# Patient Record
Sex: Female | Born: 1955
Health system: Southern US, Community
[De-identification: ages and names within clinical notes are randomized; demographics above are authoritative.]

## PROBLEM LIST (undated history)

## (undated) DIAGNOSIS — G43909 Migraine, unspecified, not intractable, without status migrainosus: Secondary | ICD-10-CM

## (undated) DIAGNOSIS — N390 Urinary tract infection, site not specified: Secondary | ICD-10-CM

## (undated) HISTORY — DX: Urinary tract infection, site not specified: N39.0

## (undated) HISTORY — DX: Migraine, unspecified, not intractable, without status migrainosus: G43.909

---

## 1982-06-04 HISTORY — PX: TUBAL LIGATION: SHX77

## 2014-10-28 ENCOUNTER — Other Ambulatory Visit: Payer: Self-pay | Admitting: Obstetrics and Gynecology

## 2014-10-28 DIAGNOSIS — N6310 Unspecified lump in the right breast, unspecified quadrant: Secondary | ICD-10-CM

## 2014-11-03 ENCOUNTER — Other Ambulatory Visit: Payer: Self-pay

## 2014-11-18 ENCOUNTER — Ambulatory Visit
Admission: RE | Admit: 2014-11-18 | Discharge: 2014-11-18 | Disposition: A | Discharge status: Home / Self Care | Payer: BLUE CROSS/BLUE SHIELD | Source: Ambulatory Visit | Attending: Obstetrics and Gynecology | Admitting: Obstetrics and Gynecology

## 2014-11-18 DIAGNOSIS — N6310 Unspecified lump in the right breast, unspecified quadrant: Secondary | ICD-10-CM

## 2016-01-11 DIAGNOSIS — H524 Presbyopia: Secondary | ICD-10-CM | POA: Diagnosis not present

## 2016-01-17 DIAGNOSIS — Z6823 Body mass index (BMI) 23.0-23.9, adult: Secondary | ICD-10-CM | POA: Diagnosis not present

## 2016-01-17 DIAGNOSIS — Z1231 Encounter for screening mammogram for malignant neoplasm of breast: Secondary | ICD-10-CM | POA: Diagnosis not present

## 2016-01-17 DIAGNOSIS — Z01419 Encounter for gynecological examination (general) (routine) without abnormal findings: Secondary | ICD-10-CM | POA: Diagnosis not present

## 2016-07-26 DIAGNOSIS — J209 Acute bronchitis, unspecified: Secondary | ICD-10-CM | POA: Diagnosis not present

## 2017-03-14 DIAGNOSIS — Z01419 Encounter for gynecological examination (general) (routine) without abnormal findings: Secondary | ICD-10-CM | POA: Diagnosis not present

## 2017-03-14 DIAGNOSIS — Z6823 Body mass index (BMI) 23.0-23.9, adult: Secondary | ICD-10-CM | POA: Diagnosis not present

## 2017-03-14 DIAGNOSIS — Z1231 Encounter for screening mammogram for malignant neoplasm of breast: Secondary | ICD-10-CM | POA: Diagnosis not present

## 2017-03-14 LAB — HM MAMMOGRAPHY

## 2017-03-14 LAB — HM PAP SMEAR: HM Pap smear: NEGATIVE

## 2017-04-01 DIAGNOSIS — Z13820 Encounter for screening for osteoporosis: Secondary | ICD-10-CM | POA: Diagnosis not present

## 2017-04-01 LAB — HM DEXA SCAN

## 2017-04-09 DIAGNOSIS — Z7689 Persons encountering health services in other specified circumstances: Secondary | ICD-10-CM | POA: Diagnosis not present

## 2017-04-09 DIAGNOSIS — M25561 Pain in right knee: Secondary | ICD-10-CM | POA: Diagnosis not present

## 2017-04-09 DIAGNOSIS — M7989 Other specified soft tissue disorders: Secondary | ICD-10-CM | POA: Diagnosis not present

## 2017-04-17 DIAGNOSIS — M7051 Other bursitis of knee, right knee: Secondary | ICD-10-CM | POA: Diagnosis not present

## 2017-04-17 DIAGNOSIS — G8929 Other chronic pain: Secondary | ICD-10-CM | POA: Diagnosis not present

## 2017-04-17 DIAGNOSIS — M25561 Pain in right knee: Secondary | ICD-10-CM | POA: Diagnosis not present

## 2017-04-24 ENCOUNTER — Ambulatory Visit: Payer: BLUE CROSS/BLUE SHIELD | Admitting: Primary Care

## 2017-05-03 ENCOUNTER — Encounter: Payer: Self-pay | Admitting: Primary Care

## 2017-05-03 ENCOUNTER — Ambulatory Visit: Payer: BLUE CROSS/BLUE SHIELD | Admitting: Primary Care

## 2017-05-03 DIAGNOSIS — M25569 Pain in unspecified knee: Secondary | ICD-10-CM | POA: Insufficient documentation

## 2017-05-03 DIAGNOSIS — M25561 Pain in right knee: Secondary | ICD-10-CM

## 2017-05-03 DIAGNOSIS — G8929 Other chronic pain: Secondary | ICD-10-CM | POA: Diagnosis not present

## 2017-05-03 NOTE — Patient Instructions (Signed)
It was a pleasure to meet you today! Please don't hesitate to call me with any questions. Welcome to [facility]!

## 2017-05-03 NOTE — Progress Notes (Signed)
Subjective:    Patient ID: Kelly James, female    DOB: [birth year], 61 y.o.   MRN: [id number]  HPI  Kelly James is a 61 year old female who presents today to establish care and discuss the problems mentioned below. Will obtain old records. She's following with GYN annually for her physicals with her last visit in October 2018.  1) Extremity Pain: Located to right upper extremity near the biceps area. This has been chronic for years, history of cortisone injections 10-11 years. Recent soreness over the past several months when moving her mother in law.   2) Knee Pain: Located to the right knee that has been present for the past several months. She was seen at Urgent Care in February 2018. She was sent to orthopedics several weeks ago and was told that she had arthritis to her right knee. She's currently taking glucosamine with chondroitin along with other supplements.    Review of Systems  Eyes: Negative for visual disturbance.  Respiratory: Negative for shortness of breath.   Cardiovascular: Negative for chest pain.  Musculoskeletal:       Right knee pain  Neurological: Negative for dizziness and headaches.       Past Medical History:  Diagnosis Date  . Migraines   . UTI (urinary tract infection)      Social History   Socioeconomic History  . Marital status: Married    Spouse name: Not on file  . Number of children: Not on file  . Years of education: Not on file  . Highest education level: Not on file  Social Needs  . Financial resource strain: Not on file  . Food insecurity - worry: Not on file  . Food insecurity - inability: Not on file  . Transportation needs - medical: Not on file  . Transportation needs - non-medical: Not on file  Occupational History  . Not on file  Tobacco Use  . Smoking status: Never Smoker  . Smokeless tobacco: Never Used  Substance and Sexual Activity  . Alcohol use: No    Frequency: Never  . Drug use: No  . Sexual activity: Not on file    Other Topics Concern  . Not on file  Social History Narrative   Married.   2 children, 2 grandchildren.   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      Family History  Problem Relation Age of Onset  . Arthritis Mother   . Alzheimer's disease Mother   . Alcohol abuse Father   . Asthma Sister   . Asthma Brother     Allergies  Allergen Reactions  . Codeine     Other reaction(s): Agitation  . Sulfa Antibiotics Hives    Current Outpatient Medications on File Prior to Visit  Medication Sig Dispense Refill  . ALFALFA PO Take by mouth.    . B Complex-Biotin-FA (B COMPLETE PO) Take by mouth.    . Cholecalciferol (D3-1000 PO) Take by mouth.    . Multiple Vitamins-Minerals (ZINC PO) Take by mouth.    Marland Kitchen UNABLE TO FIND Med Name: CarotoMax and Eye Taking 1 capsule by mouth after breakfast daily    . UNABLE TO FIND Herb-Lax Taking 1 tablet by mouth after breakfast daily    . VITAMIN E PO Take by mouth.    . Ascorbic Acid (CHEWABLE VITAMIN C PO) Take 100 mg by mouth.     No current facility-administered medications on file prior to visit.  BP 122/82   Pulse 88   Temp 98.1 F (36.7 C) (Oral)   Ht 5' (1.524 m)   Wt 123 lb 12.8 oz (56.2 kg)   SpO2 99%   BMI 24.18 kg/m    Objective:   Physical Exam  Constitutional: She appears well-nourished.  Neck: Neck supple.  Cardiovascular: Normal rate and regular rhythm.  Pulmonary/Chest: Effort normal and breath sounds normal.  Musculoskeletal:  Ambulates well   Neurological: Coordination normal.  Skin: Skin is warm and dry.  Psychiatric: She has a normal mood and affect.          Assessment & Plan:

## 2017-05-03 NOTE — Assessment & Plan Note (Signed)
Present for the past several months, now following with orthopedics. Continue supplements as recommended. Follow up with ortho as scheduled.

## 2017-05-15 ENCOUNTER — Encounter: Payer: Self-pay | Admitting: Primary Care

## 2017-05-15 NOTE — Telephone Encounter (Signed)
FYI, updated Health Maintenance of the information that was given

## 2017-05-17 DIAGNOSIS — H524 Presbyopia: Secondary | ICD-10-CM | POA: Diagnosis not present

## 2017-05-29 ENCOUNTER — Encounter: Payer: Self-pay | Admitting: Primary Care

## 2017-06-19 DIAGNOSIS — M7521 Bicipital tendinitis, right shoulder: Secondary | ICD-10-CM | POA: Diagnosis not present

## 2017-06-19 DIAGNOSIS — G8929 Other chronic pain: Secondary | ICD-10-CM | POA: Diagnosis not present

## 2017-06-19 DIAGNOSIS — M25511 Pain in right shoulder: Secondary | ICD-10-CM | POA: Diagnosis not present

## 2017-07-01 ENCOUNTER — Encounter: Payer: Self-pay | Admitting: Primary Care

## 2017-07-19 DIAGNOSIS — M7521 Bicipital tendinitis, right shoulder: Secondary | ICD-10-CM | POA: Diagnosis not present

## 2017-07-19 DIAGNOSIS — M25511 Pain in right shoulder: Secondary | ICD-10-CM | POA: Diagnosis not present

## 2017-07-19 DIAGNOSIS — G8929 Other chronic pain: Secondary | ICD-10-CM | POA: Diagnosis not present

## 2017-08-15 ENCOUNTER — Encounter: Payer: Self-pay | Admitting: Primary Care

## 2017-09-16 ENCOUNTER — Encounter: Payer: Self-pay | Admitting: Primary Care

## 2017-10-01 ENCOUNTER — Encounter: Payer: Self-pay | Admitting: Primary Care

## 2017-11-11 ENCOUNTER — Encounter: Payer: Self-pay | Admitting: Primary Care

## 2018-04-28 DIAGNOSIS — Z6824 Body mass index (BMI) 24.0-24.9, adult: Secondary | ICD-10-CM | POA: Diagnosis not present

## 2018-04-28 DIAGNOSIS — Z1231 Encounter for screening mammogram for malignant neoplasm of breast: Secondary | ICD-10-CM | POA: Diagnosis not present

## 2018-04-28 DIAGNOSIS — Z01419 Encounter for gynecological examination (general) (routine) without abnormal findings: Secondary | ICD-10-CM | POA: Diagnosis not present

## 2018-05-19 DIAGNOSIS — H524 Presbyopia: Secondary | ICD-10-CM | POA: Diagnosis not present

## 2018-07-08 DIAGNOSIS — Z1211 Encounter for screening for malignant neoplasm of colon: Secondary | ICD-10-CM | POA: Diagnosis not present

## 2018-07-08 DIAGNOSIS — K648 Other hemorrhoids: Secondary | ICD-10-CM | POA: Diagnosis not present

## 2018-07-08 LAB — HM COLONOSCOPY

## 2018-07-10 ENCOUNTER — Encounter: Payer: Self-pay | Admitting: Primary Care

## 2018-08-26 NOTE — Telephone Encounter (Signed)
Kelly James, she may be a good candidate for WebEx, but if it's not working then phone is fine.

## 2018-08-26 NOTE — Telephone Encounter (Signed)
lvm asking pt to call office to schedule 

## 2018-08-27 ENCOUNTER — Other Ambulatory Visit: Payer: Self-pay

## 2018-08-27 ENCOUNTER — Telehealth (INDEPENDENT_AMBULATORY_CARE_PROVIDER_SITE_OTHER): Payer: BLUE CROSS/BLUE SHIELD | Admitting: Primary Care

## 2018-08-27 DIAGNOSIS — R42 Dizziness and giddiness: Secondary | ICD-10-CM | POA: Diagnosis not present

## 2018-08-27 NOTE — Patient Instructions (Signed)
Your symptoms are representative of vertigo as discussed. Take a look at the information below.   You can try meclizine medication if your symptoms return. This can me purchased over the counter.  Go to the hospital if you develop one sided weakness, numbness/tingling to the arms and legs, changes in speech.  It was nice speaking with you! Allie Bossier, NP-C   Vertigo Vertigo is the feeling that you or your surroundings are moving when they are not. Vertigo can be dangerous if it occurs while you are doing something that could endanger you or others, such as driving. What are the causes? This condition is caused by a disturbance in the signals that are sent by your body's sensory systems to your brain. Different causes of a disturbance can lead to vertigo, including:  Infections, especially in the inner ear.  A bad reaction to a drug, or misuse of alcohol and medicines.  Withdrawal from drugs or alcohol.  Quickly changing positions, as when lying down or rolling over in bed.  Migraine headaches.  Decreased blood flow to the brain.  Decreased blood pressure.  Increased pressure in the brain from a head or neck injury, stroke, infection, tumor, or bleeding.  Central nervous system disorders. What are the signs or symptoms? Symptoms of this condition usually occur when you move your head or your eyes in different directions. Symptoms may start suddenly, and they usually last for less than a minute. Symptoms may include:  Loss of balance and falling.  Feeling like you are spinning or moving.  Feeling like your surroundings are spinning or moving.  Nausea and vomiting.  Blurred vision or double vision.  Difficulty hearing.  Slurred speech.  Dizziness.  Involuntary eye movement (nystagmus). Symptoms can be mild and cause only slight annoyance, or they can be severe and interfere with daily life. Episodes of vertigo may return (recur) over time, and they are often triggered  by certain movements. Symptoms may improve over time. How is this diagnosed? This condition may be diagnosed based on medical history and the quality of your nystagmus. Your health care provider may test your eye movements by asking you to quickly change positions to trigger the nystagmus. This may be called the Dix-Hallpike test, head thrust test, or roll test. You may be referred to a health care provider who specializes in ear, nose, and throat (ENT) problems (otolaryngologist) or a provider who specializes in disorders of the central nervous system (neurologist). You may have additional testing, including:  A physical exam.  Blood tests.  MRI.  A CT scan.  An electrocardiogram (ECG). This records electrical activity in your heart.  An electroencephalogram (EEG). This records electrical activity in your brain.  Hearing tests. How is this treated? Treatment for this condition depends on the cause and the severity of the symptoms. Treatment options include:  Medicines to treat nausea or vertigo. These are usually used for severe cases. Some medicines that are used to treat other conditions may also reduce or eliminate vertigo symptoms. These include: ? Medicines that control allergies (antihistamines). ? Medicines that control seizures (anticonvulsants). ? Medicines that relieve depression (antidepressants). ? Medicines that relieve anxiety (sedatives).  Head movements to adjust your inner ear back to normal. If your vertigo is caused by an ear problem, your health care provider may recommend certain movements to correct the problem.  Surgery. This is rare. Follow these instructions at home: Safety  Move slowly.Avoid sudden body or head movements.  Avoid driving.  Avoid operating  heavy machinery.  Avoid doing any tasks that would cause danger to you or others if you would have a vertigo episode during the task.  If you have trouble walking or keeping your balance, try using  a cane for stability. If you feel dizzy or unstable, sit down right away.  Return to your normal activities as told by your health care provider. Ask your health care provider what activities are safe for you. General instructions  Take over-the-counter and prescription medicines only as told by your health care provider.  Avoid certain positions or movements as told by your health care provider.  Drink enough fluid to keep your urine clear or pale yellow.  Keep all follow-up visits as told by your health care provider. This is important. Contact a health care provider if:  Your medicines do not relieve your vertigo or they make it worse.  You have a fever.  Your condition gets worse or you develop new symptoms.  Your family or friends notice any behavioral changes.  Your nausea or vomiting gets worse.  You have numbness or a "pins and needles" sensation in part of your body. Get help right away if:  You have difficulty moving or speaking.  You are always dizzy.  You faint.  You develop severe headaches.  You have weakness in your hands, arms, or legs.  You have changes in your hearing or vision.  You develop a stiff neck.  You develop sensitivity to light. This information is not intended to replace advice given to you by your health care provider. Make sure you discuss any questions you have with your health care provider. Document Released: 02/28/2005 Document Revised: 11/02/2015 Document Reviewed: 09/13/2014 Elsevier Interactive Patient Education  2019 [employer].

## 2018-08-27 NOTE — Assessment & Plan Note (Signed)
Symptoms representative of vertigo, especially with symptoms of spinning sensation. Low suspicion for stroke given lack of cardinal symptoms. Unable to complete neuro exam via phone, we could not get WebEx video to work.  She admits to being under a lot of stress at home which could have triggered this episode. Discussed strict hospital precautions. Also discussed use of Meclizine if needed. She will update if symptoms return.

## 2018-08-27 NOTE — Progress Notes (Addendum)
   Kelly James - 63 y.o. female  MRN [id number]  Date of Birth: Dec 20, 1955  PCP: Pleas Koch, NP  This service was provided via telemedicine. Phone Visit performed on 08/27/2018    Rationale for phone visit reviewed. Patient consented to telephone encounter.  Web Ex was attempted but did not work.    Location of patient: Home Location of provider: Office at [employer] @ [facility] Name of referring provider: N/A   Names of persons and role in encounter: Provider: Pleas Koch, NP  Patient: Kelly James  Other: N/A   Time on call: 15 minutes   Subjective: CC: Dizziness HPI:  Two nights ago she was preparing to go to bed and began to feel lightheaded and nauseated. She went to lay down in bed and felt her head spinning, felt like the room was spinning, felt like her eyes were "wobbling". She closed her eyes with some improvement but still felt her head spinning. She continued to lay flat with improvement but when she attempted to roll over onto her side and her symptoms returned. She felt the need to use the bathroom so her husband assisted her to the bathroom, felt unsteady when sitting on the toilet and also with wiping. She fell asleep, woke up again to urinate in the middle of the night, felt slightly more steady and was able to walk in herself without assistance. She returned to bed and when laying down felt her head slightly spinning. She feel back asleep, woke back up to urinate again and felt nearly resolved.   This morning she woke up with a "migraine" with nausea, headache was located to the entire head. She took Tylenol Arthritis on an empty stomach with eventual resolve in her headache. She denies vomiting, diarrhea, fevers, syncope, changes in speech, unilateral weakness.    Objective/Observations:   No physical exam or vital signs collected unless specifically identified.  Respiratory status: speaks in complete sentences without evident shortness of breath.    Assessment/Plan:  Symptoms representative of vertigo, especially with symptoms of spinning sensation. Low suspicion for stroke given lack of cardinal symptoms. Unable to complete neuro exam via phone, we could not get WebEx video to work.  She admits to being under a lot of stress at home which could have triggered this episode. Discussed strict hospital precautions. Also discussed use of Meclizine if needed. She will update if symptoms return.   No problem-specific Assessment & Plan notes found for this encounter.   I discussed the assessment and treatment plan with the patient. The patient was provided an opportunity to ask questions and all were answered. The patient agreed with the plan and demonstrated an understanding of the instructions.  Lab Orders  No laboratory test(s) ordered today    No orders of the defined types were placed in this encounter.   The patient was advised to call back or seek an in-person evaluation if the symptoms worsen or if the condition fails to improve as anticipated.  Pleas Koch, NP

## 2019-02-04 DIAGNOSIS — K648 Other hemorrhoids: Secondary | ICD-10-CM | POA: Diagnosis not present

## 2019-02-18 DIAGNOSIS — K648 Other hemorrhoids: Secondary | ICD-10-CM | POA: Diagnosis not present

## 2019-03-11 DIAGNOSIS — K648 Other hemorrhoids: Secondary | ICD-10-CM | POA: Diagnosis not present

## 2019-05-21 DIAGNOSIS — Z6824 Body mass index (BMI) 24.0-24.9, adult: Secondary | ICD-10-CM | POA: Diagnosis not present

## 2019-05-21 DIAGNOSIS — Z01419 Encounter for gynecological examination (general) (routine) without abnormal findings: Secondary | ICD-10-CM | POA: Diagnosis not present

## 2019-05-21 DIAGNOSIS — M8580 Other specified disorders of bone density and structure, unspecified site: Secondary | ICD-10-CM | POA: Diagnosis not present

## 2019-05-21 DIAGNOSIS — Z1231 Encounter for screening mammogram for malignant neoplasm of breast: Secondary | ICD-10-CM | POA: Diagnosis not present

## 2019-05-21 DIAGNOSIS — Z13820 Encounter for screening for osteoporosis: Secondary | ICD-10-CM | POA: Diagnosis not present

## 2019-05-21 LAB — HM DEXA SCAN

## 2019-05-22 LAB — HM MAMMOGRAPHY

## 2019-06-22 DIAGNOSIS — H52223 Regular astigmatism, bilateral: Secondary | ICD-10-CM | POA: Diagnosis not present

## 2019-07-05 ENCOUNTER — Other Ambulatory Visit: Payer: Self-pay | Admitting: Primary Care

## 2019-07-05 DIAGNOSIS — Z1159 Encounter for screening for other viral diseases: Secondary | ICD-10-CM

## 2019-07-05 DIAGNOSIS — Z0000 Encounter for general adult medical examination without abnormal findings: Secondary | ICD-10-CM

## 2019-07-10 NOTE — Telephone Encounter (Signed)
Kelly James, we need to move her CPE near a block. This visit is going to take 40+ minutes.

## 2019-07-13 ENCOUNTER — Other Ambulatory Visit: Payer: Self-pay

## 2019-07-13 ENCOUNTER — Other Ambulatory Visit (INDEPENDENT_AMBULATORY_CARE_PROVIDER_SITE_OTHER): Payer: BLUE CROSS/BLUE SHIELD

## 2019-07-13 DIAGNOSIS — Z0000 Encounter for general adult medical examination without abnormal findings: Secondary | ICD-10-CM

## 2019-07-13 DIAGNOSIS — Z1159 Encounter for screening for other viral diseases: Secondary | ICD-10-CM

## 2019-07-13 LAB — LIPID PANEL
Cholesterol: 212 mg/dL — ABNORMAL HIGH (ref 0–200)
HDL: 53.7 mg/dL (ref >39.00)
NonHDL: 158.77
Total CHOL/HDL Ratio: 4
Triglycerides: 210 mg/dL — ABNORMAL HIGH (ref 0.0–149.0)
VLDL: 42 mg/dL — ABNORMAL HIGH (ref 0.0–40.0)

## 2019-07-13 LAB — COMPREHENSIVE METABOLIC PANEL
ALT: 22 U/L (ref 0–35)
AST: 22 U/L (ref 0–37)
Albumin: 4.6 g/dL (ref 3.5–5.2)
Alkaline Phosphatase: 54 U/L (ref 39–117)
BUN: 19 mg/dL (ref 6–23)
CO2: 30 mEq/L (ref 19–32)
Calcium: 9.9 mg/dL (ref 8.4–10.5)
Chloride: 103 mEq/L (ref 96–112)
Creatinine, Ser: 0.91 mg/dL (ref 0.40–1.20)
GFR: 62.18 mL/min (ref >60.00)
Glucose, Bld: 93 mg/dL (ref 70–99)
Potassium: 4.1 mEq/L (ref 3.5–5.1)
Sodium: 139 mEq/L (ref 135–145)
Total Bilirubin: 0.7 mg/dL (ref 0.2–1.2)
Total Protein: 7.7 g/dL (ref 6.0–8.3)

## 2019-07-13 LAB — CBC
HCT: 40.9 % (ref 36.0–46.0)
Hemoglobin: 14 g/dL (ref 12.0–15.0)
MCHC: 34.2 g/dL (ref 30.0–36.0)
MCV: 96 fl (ref 78.0–100.0)
Platelets: 244 10*3/uL (ref 150.0–400.0)
RBC: 4.25 Mil/uL (ref 3.87–5.11)
RDW: 12.4 % (ref 11.5–15.5)
WBC: 5.2 10*3/uL (ref 4.0–10.5)

## 2019-07-13 LAB — LDL CHOLESTEROL, DIRECT: Direct LDL: 133 mg/dL

## 2019-07-14 LAB — HEPATITIS C ANTIBODY
Hepatitis C Ab: NONREACTIVE
SIGNAL TO CUT-OFF: 0.02 (ref <1.00)

## 2019-07-17 ENCOUNTER — Encounter: Payer: Self-pay | Admitting: Primary Care

## 2019-07-17 ENCOUNTER — Ambulatory Visit (INDEPENDENT_AMBULATORY_CARE_PROVIDER_SITE_OTHER): Payer: BLUE CROSS/BLUE SHIELD | Admitting: Primary Care

## 2019-07-17 ENCOUNTER — Other Ambulatory Visit: Payer: Self-pay

## 2019-07-17 VITALS — BP 124/82 | HR 84 | Temp 97.2°F | Ht 60.0 in | Wt 126.5 lb

## 2019-07-17 DIAGNOSIS — K219 Gastro-esophageal reflux disease without esophagitis: Secondary | ICD-10-CM

## 2019-07-17 DIAGNOSIS — Z0000 Encounter for general adult medical examination without abnormal findings: Secondary | ICD-10-CM | POA: Diagnosis not present

## 2019-07-17 DIAGNOSIS — E785 Hyperlipidemia, unspecified: Secondary | ICD-10-CM | POA: Diagnosis not present

## 2019-07-17 DIAGNOSIS — Z23 Encounter for immunization: Secondary | ICD-10-CM | POA: Diagnosis not present

## 2019-07-17 DIAGNOSIS — R21 Rash and other nonspecific skin eruption: Secondary | ICD-10-CM

## 2019-07-17 NOTE — Assessment & Plan Note (Signed)
Tetanus and influenza due, provided today. Shingrix due, she will get a few weeks. Pap smear and mammogram UTD. Colonoscopy UTD. Encouraged a healthy diet and regular exercise.  Exam today unremarkable.  Labs reviewed.

## 2019-07-17 NOTE — Assessment & Plan Note (Signed)
LDL with slight increase. Encouraged regular exercise, healthy diet. Continue to monitor.

## 2019-07-17 NOTE — Assessment & Plan Note (Signed)
Intermittent, likely dietary induced. Handout provided regarding triggers. Discussed use of Tums if needed, Pepcid nightly for more recurrent symptoms.

## 2019-07-17 NOTE — Addendum Note (Signed)
Addended by: Jacqualin Combes on: 07/17/2019 12:11 PM   Modules accepted: Orders

## 2019-07-17 NOTE — Patient Instructions (Addendum)
Start exercising. You should be getting 150 minutes of moderate intensity exercise weekly.  It's important to improve your diet by reducing consumption of fast food, fried food, processed snack foods, sugary drinks. Increase consumption of fresh vegetables and fruits, whole grains, water.  Ensure you are drinking 64 ounces of water daily.  You will be contacted regarding your referral to dermatology.  Please let us know if you have not been contacted within two weeks.   It was a pleasure to see you today!   Preventive Care 79-85 Years Old, Female Preventive care refers to visits with your health care provider and lifestyle choices that can promote health and wellness. This includes:  A yearly physical exam. This may also be called an annual well check.  Regular dental visits and eye exams.  Immunizations.  Screening for certain conditions.  Healthy lifestyle choices, such as eating a healthy diet, getting regular exercise, not using drugs or products that contain nicotine and tobacco, and limiting alcohol use. What can I expect for my preventive care visit? Physical exam Your health care provider will check your:  Height and weight. This may be used to calculate body mass index (BMI), which tells if you are at a healthy weight.  Heart rate and blood pressure.  Skin for abnormal spots. Counseling Your health care provider may ask you questions about your:  Alcohol, tobacco, and drug use.  Emotional well-being.  Home and relationship well-being.  Sexual activity.  Eating habits.  Work and work Statistician.  Method of birth control.  Menstrual cycle.  Pregnancy history. What immunizations do I need?  Influenza (flu) vaccine  This is recommended every year. Tetanus, diphtheria, and pertussis (Tdap) vaccine  You may need a Td booster every 10 years. Varicella (chickenpox) vaccine  You may need this if you have not been vaccinated. Zoster (shingles) vaccine   You may need this after age 74. Measles, mumps, and rubella (MMR) vaccine  You may need at least one dose of MMR if you were born in 1957 or later. You may also need a second dose. Pneumococcal conjugate (PCV13) vaccine  You may need this if you have certain conditions and were not previously vaccinated. Pneumococcal polysaccharide (PPSV23) vaccine  You may need one or two doses if you smoke cigarettes or if you have certain conditions. Meningococcal conjugate (MenACWY) vaccine  You may need this if you have certain conditions. Hepatitis A vaccine  You may need this if you have certain conditions or if you travel or work in places where you may be exposed to hepatitis A. Hepatitis B vaccine  You may need this if you have certain conditions or if you travel or work in places where you may be exposed to hepatitis B. Haemophilus influenzae type b (Hib) vaccine  You may need this if you have certain conditions. Human papillomavirus (HPV) vaccine  If recommended by your health care provider, you may need three doses over 6 months. You may receive vaccines as individual doses or as more than one vaccine together in one shot (combination vaccines). Talk with your health care provider about the risks and benefits of combination vaccines. What tests do I need? Blood tests  Lipid and cholesterol levels. These may be checked every 5 years, or more frequently if you are over 30 years old.  Hepatitis C test.  Hepatitis B test. Screening  Lung cancer screening. You may have this screening every year starting at age 52 if you have a 30-pack-year history of  smoking and currently smoke or have quit within the past 15 years.  Colorectal cancer screening. All adults should have this screening starting at age 87 and continuing until age 52. Your health care provider may recommend screening at age 87 if you are at increased risk. You will have tests every 1-10 years, depending on your results and  the type of screening test.  Diabetes screening. This is done by checking your blood sugar (glucose) after you have not eaten for a while (fasting). You may have this done every 1-3 years.  Mammogram. This may be done every 1-2 years. Talk with your health care provider about when you should start having regular mammograms. This may depend on whether you have a family history of breast cancer.  BRCA-related cancer screening. This may be done if you have a family history of breast, ovarian, tubal, or peritoneal cancers.  Pelvic exam and Pap test. This may be done every 3 years starting at age 1. Starting at age 70, this may be done every 5 years if you have a Pap test in combination with an HPV test. Other tests  Sexually transmitted disease (STD) testing.  Bone density scan. This is done to screen for osteoporosis. You may have this scan if you are at high risk for osteoporosis. Follow these instructions at home: Eating and drinking  Eat a diet that includes fresh fruits and vegetables, whole grains, lean protein, and low-fat dairy.  Take vitamin and mineral supplements as recommended by your health care provider.  Do not drink alcohol if: ? Your health care provider tells you not to drink. ? You are pregnant, may be pregnant, or are planning to become pregnant.  If you drink alcohol: ? Limit how much you have to 0-1 drink a day. ? Be aware of how much alcohol is in your drink. In the [place] one drink equals one 12 oz bottle of beer (355 mL), one 5 oz glass of wine (148 mL), or one 1 oz glass of hard liquor (44 mL). Lifestyle  Take daily care of your teeth and gums.  Stay active. Exercise for at least 30 minutes on 5 or more days each week.  Do not use any products that contain nicotine or tobacco, such as cigarettes, e-cigarettes, and chewing tobacco. If you need help quitting, ask your health care provider.  If you are sexually active, practice safe sex. Use a condom or other  form of birth control (contraception) in order to prevent pregnancy and STIs (sexually transmitted infections).  If told by your health care provider, take low-dose aspirin daily starting at age 27. What's next?  Visit your health care provider once a year for a well check visit.  Ask your health care provider how often you should have your eyes and teeth checked.  Stay up to date on all vaccines. This information is not intended to replace advice given to you by your health care provider. Make sure you discuss any questions you have with your health care provider. Document Revised: 01/30/2018 Document Reviewed: 01/30/2018 Elsevier Patient Education  2020 [place] for Gastroesophageal Reflux Disease, Adult When you have gastroesophageal reflux disease (GERD), the foods you eat and your eating habits are very important. Choosing the right foods can help ease your discomfort. Think about working with a nutrition specialist (dietitian) to help you make good choices. What are tips for following this plan?  Meals  Choose healthy foods that are low in fat, such  as fruits, vegetables, whole grains, low-fat dairy products, and lean meat, fish, and poultry.  Eat small meals often instead of 3 large meals a day. Eat your meals slowly, and in a place where you are relaxed. Avoid bending over or lying down until 2-3 hours after eating.  Avoid eating meals 2-3 hours before bed.  Avoid drinking a lot of liquid with meals.  Cook foods using methods other than frying. Bake, grill, or broil food instead.  Avoid or limit: ? Chocolate. ? Peppermint or spearmint. ? Alcohol. ? Pepper. ? Black and decaffeinated coffee. ? Black and decaffeinated tea. ? Bubbly (carbonated) soft drinks. ? Caffeinated energy drinks and soft drinks.  Limit high-fat foods such as: ? Fatty meat or fried foods. ? Whole milk, cream, butter, or ice cream. ? Nuts and nut butters. ? Pastries, donuts,  and sweets made with butter or shortening.  Avoid foods that cause symptoms. These foods may be different for everyone. Common foods that cause symptoms include: ? Tomatoes. ? Oranges, lemons, and limes. ? Peppers. ? Spicy food. ? Onions and garlic. ? Vinegar. Lifestyle  Maintain a healthy weight. Ask your doctor what weight is healthy for you. If you need to lose weight, work with your doctor to do so safely.  Exercise for at least 30 minutes for 5 or more days each week, or as told by your doctor.  Wear loose-fitting clothes.  Do not smoke. If you need help quitting, ask your doctor.  Sleep with the head of your bed higher than your feet. Use a wedge under the mattress or blocks under the bed frame to raise the head of the bed. Summary  When you have gastroesophageal reflux disease (GERD), food and lifestyle choices are very important in easing your symptoms.  Eat small meals often instead of 3 large meals a day. Eat your meals slowly, and in a place where you are relaxed.  Limit high-fat foods such as fatty meat or fried foods.  Avoid bending over or lying down until 2-3 hours after eating.  Avoid peppermint and spearmint, caffeine, alcohol, and chocolate. This information is not intended to replace advice given to you by your health care provider. Make sure you discuss any questions you have with your health care provider. Document Revised: 09/11/2018 Document Reviewed: 06/26/2016 Elsevier Patient Education  [place].

## 2019-07-17 NOTE — Progress Notes (Signed)
Subjective:    Patient ID: Kelly James, female    DOB: 08-Oct-1955, 64 y.o.   MRN: [id number]  HPI  This visit occurred during the SARS-CoV-2 public health emergency.  Safety protocols were in place, including screening questions prior to the visit, additional usage of staff PPE, and extensive cleaning of exam room while observing appropriate contact time as indicated for disinfecting solutions.   Kelly James is a 64 year old female who presents today for complete physical.  Chronic and intermittent itching and rash to left medial lower extremity proximal to ankle x several months. Also with small light pink bumps to lower extremities, dryness to posterior trunk. She'll apply hydrocortisone cream with temporary improvement. She denies changes in soaps, detergents, lotions, clothing. She stopped using her shower gel to see if this was causing symptoms.  Immunizations: -Tetanus: No recent.  -Influenza: Due today. -Shingles: Never completed   Diet: She endorses a fair diet.  Exercise: She is not exercising.  Eye exam: Completes regularly  Dental exam: Completes semi-annually   Pap Smear: UTD Mammogram: Completed in 2020, follows with GYN Dexa: Completed in 2020 Colonoscopy: Completed in 2020 Hep C Screen: Negative  BP Readings from Last 3 Encounters:  07/17/19 124/82  05/03/17 122/82      Review of Systems  Constitutional: Negative for unexpected weight change.  HENT: Negative for rhinorrhea.   Respiratory: Negative for cough and shortness of breath.   Cardiovascular: Negative for chest pain.  Gastrointestinal: Negative for blood in stool, constipation and diarrhea.  Genitourinary: Negative for difficulty urinating.  Musculoskeletal: Negative for arthralgias.  Skin: Positive for rash.  Allergic/Immunologic: Negative for environmental allergies.  Neurological: Negative for dizziness, numbness and headaches.  Psychiatric/Behavioral: The patient is not nervous/anxious.         Past Medical History:  Diagnosis Date  . Migraines   . UTI (urinary tract infection)      Social History   Socioeconomic History  . Marital status: Married    Spouse name: Not on file  . Number of children: Not on file  . Years of education: Not on file  . Highest education level: Not on file  Occupational History  . Not on file  Tobacco Use  . Smoking status: Never Smoker  . Smokeless tobacco: Never Used  Substance and Sexual Activity  . Alcohol use: No  . Drug use: No  . Sexual activity: Not on file  Other Topics Concern  . Not on file  Social History Narrative   Married.   2 children, 2 grandchildren.   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   Social Determinants of Health   Financial Resource Strain:   . Difficulty of Paying Living Expenses: Not on file  Food Insecurity:   . Worried About Charity fundraiser in the Last Year: Not on file  . Ran Out of Food in the Last Year: Not on file  Transportation Needs:   . Lack of Transportation (Medical): Not on file  . Lack of Transportation (Non-Medical): Not on file  Physical Activity:   . Days of Exercise per Week: Not on file  . Minutes of Exercise per Session: Not on file  Stress:   . Feeling of Stress : Not on file  Social Connections:   . Frequency of Communication with Friends and Family: Not on file  . Frequency of Social Gatherings with Friends and Family: Not on file  . Attends Religious Services: Not on file  .  Active Member of Clubs or Organizations: Not on file  . Attends Archivist Meetings: Not on file  . Marital Status: Not on file  Intimate Partner Violence:   . Fear of Current or Ex-Partner: Not on file  . Emotionally Abused: Not on file  . Physically Abused: Not on file  . Sexually Abused: Not on file      Family History  Problem Relation Age of Onset  . Arthritis Mother   . Alzheimer's disease Mother   . Alcohol abuse Father   . Asthma Sister   .  Asthma Brother     Allergies  Allergen Reactions  . Codeine     Other reaction(s): Agitation  . Sulfa Antibiotics Hives    Current Outpatient Medications on File Prior to Visit  Medication Sig Dispense Refill  . ALFALFA PO Take by mouth.    . Ascorbic Acid (CHEWABLE VITAMIN C PO) Take 100 mg by mouth.    . B Complex-Biotin-FA (B COMPLETE PO) Take by mouth.    . Cholecalciferol (D3-1000 PO) Take by mouth.    . Misc Natural Products (JOINT SUPPORT COMPLEX PO) Take by mouth.    . Multiple Vitamins-Minerals (ZINC PO) Take by mouth.    . Nutritional Supplements (SOY PROTEIN SHAKE) POWD Take by mouth.    Marland Kitchen UNABLE TO FIND Med Name: CarotoMax and Eye Taking 1 capsule by mouth after breakfast daily    . UNABLE TO FIND Herb-Lax Taking 1 tablet by mouth after breakfast daily    . UNABLE TO FIND Med Name: Mind Work    . VITAMIN E PO Take by mouth.    Marland Kitchen ZINC OXIDE PO Take by mouth.     No current facility-administered medications on file prior to visit.    BP 124/82   Pulse 84   Temp (!) 97.2 F (36.2 C) (Temporal)   Ht 5' (1.524 m)   Wt 126 lb 8 oz (57.4 kg)   SpO2 99%   BMI 24.71 kg/m    Objective:   Physical Exam  Constitutional: She is oriented to person, place, and time. She appears well-nourished.  HENT:  Right Ear: Tympanic membrane and ear canal normal.  Left Ear: Tympanic membrane and ear canal normal.  Mouth/Throat: Oropharynx is clear and moist.  Eyes: Pupils are equal, round, and reactive to light. EOM are normal.  Cardiovascular: Normal rate and regular rhythm.  Respiratory: Effort normal and breath sounds normal.  GI: Soft. Bowel sounds are normal. There is no abdominal tenderness.  Musculoskeletal:        General: Normal range of motion.     Cervical back: Neck supple.  Neurological: She is alert and oriented to person, place, and time. No cranial nerve deficit.  Reflex Scores:      Patellar reflexes are 2+ on the right side and 2+ on the left side. Skin:  Skin is warm and dry.  Psychiatric: She has a normal mood and affect.           Assessment & Plan:

## 2019-07-22 DIAGNOSIS — R21 Rash and other nonspecific skin eruption: Secondary | ICD-10-CM

## 2019-07-30 DIAGNOSIS — D485 Neoplasm of uncertain behavior of skin: Secondary | ICD-10-CM | POA: Diagnosis not present

## 2019-07-30 DIAGNOSIS — L821 Other seborrheic keratosis: Secondary | ICD-10-CM | POA: Diagnosis not present

## 2019-07-30 DIAGNOSIS — L820 Inflamed seborrheic keratosis: Secondary | ICD-10-CM | POA: Diagnosis not present

## 2019-08-03 ENCOUNTER — Telehealth: Payer: Self-pay

## 2019-08-03 NOTE — Telephone Encounter (Signed)
Pt has NV tomorrow. Need to screen her for covid. LVM

## 2019-08-04 ENCOUNTER — Ambulatory Visit (INDEPENDENT_AMBULATORY_CARE_PROVIDER_SITE_OTHER): Payer: BLUE CROSS/BLUE SHIELD | Admitting: *Deleted

## 2019-08-04 ENCOUNTER — Other Ambulatory Visit: Payer: Self-pay

## 2019-08-04 DIAGNOSIS — Z23 Encounter for immunization: Secondary | ICD-10-CM | POA: Diagnosis not present

## 2019-08-04 NOTE — Progress Notes (Signed)
Per orders of Alma Friendly, NP, injection of Shingrix  given by Virl Cagey. Patient tolerated injection well.

## 2019-09-10 ENCOUNTER — Other Ambulatory Visit: Payer: Self-pay

## 2019-09-10 ENCOUNTER — Ambulatory Visit (INDEPENDENT_AMBULATORY_CARE_PROVIDER_SITE_OTHER): Payer: BLUE CROSS/BLUE SHIELD | Admitting: Dermatology

## 2019-09-10 DIAGNOSIS — D2272 Melanocytic nevi of left lower limb, including hip: Secondary | ICD-10-CM | POA: Diagnosis not present

## 2019-09-10 DIAGNOSIS — D485 Neoplasm of uncertain behavior of skin: Secondary | ICD-10-CM

## 2019-09-10 DIAGNOSIS — L821 Other seborrheic keratosis: Secondary | ICD-10-CM | POA: Diagnosis not present

## 2019-09-10 DIAGNOSIS — D229 Melanocytic nevi, unspecified: Secondary | ICD-10-CM

## 2019-09-10 HISTORY — DX: Melanocytic nevi, unspecified: D22.9

## 2019-09-10 NOTE — Patient Instructions (Signed)

## 2019-09-10 NOTE — Progress Notes (Signed)
   Follow-Up Visit   Subjective  Kelly James is a 64 y.o. female who presents for the following: irregular nevus (L ant thigh above the knee - previously noted on the last visit. Patient is here today for shave removal and biopsy.). Last seen about 2 mos ago for ISKs of legs, now improved on Amlactin Rapid relief treatment.  The following portions of the chart were reviewed this encounter and updated as appropriate: Tobacco  Allergies  Meds  Problems  Med Hx  Surg Hx  Fam Hx     Review of Systems: No other skin or systemic complaints.  Objective  Well appearing patient in no apparent distress; mood and affect are within normal limits.  A focused examination was performed including the left leg. Relevant physical exam findings are noted in the Assessment and Plan.  Objective  L ant thigh near the knee: 0.2 cm dark brown macule  Assessment & Plan  Neoplasm of uncertain behavior of skin L ant thigh near the knee  Epidermal / dermal shaving  Lesion length (cm):  0.2 Lesion width (cm):  0.2 Margin per side (cm):  0.2 Total excision diameter (cm):  0.6 Informed consent: discussed and consent obtained   Timeout: patient name, date of birth, surgical site, and procedure verified   Procedure prep:  Patient was prepped and draped in usual sterile fashion Prep type:  Isopropyl alcohol Anesthesia: the lesion was anesthetized in a standard fashion   Anesthetic:  1% lidocaine w/ epinephrine 1-100,000 buffered w/ 8.4% NaHCO3 Instrument used: flexible razor blade   Hemostasis achieved with: pressure, aluminum chloride and electrodesiccation   Outcome: patient tolerated procedure well   Post-procedure details: sterile dressing applied and wound care instructions given   Dressing type: bandage and petrolatum    Specimen 1 - Surgical pathology Differential Diagnosis: D48.5 r/o dysplastic nevus Check Margins: No 0.2 cm dark brown macule  Seborrheic Keratoses of legs - no longer  inflamed after Amlactin Rapid Relief lotion tx. - Stuck-on, waxy, tan-brown papules and plaques  - Discussed benign etiology and prognosis. - Observe - Call for any changes   Return if symptoms worsen or fail to improve.   Luther Redo, CMA, am acting as scribe for Sarina Ser, MD .

## 2019-09-11 ENCOUNTER — Encounter: Payer: Self-pay | Admitting: Dermatology

## 2019-09-15 ENCOUNTER — Telehealth: Payer: Self-pay

## 2019-09-15 NOTE — Telephone Encounter (Signed)
Advised patient of pathology results and scheduled a 6 month appointment for TBSE.

## 2019-10-15 DIAGNOSIS — Z23 Encounter for immunization: Secondary | ICD-10-CM | POA: Diagnosis not present

## 2019-11-05 DIAGNOSIS — Z23 Encounter for immunization: Secondary | ICD-10-CM | POA: Diagnosis not present

## 2019-12-24 ENCOUNTER — Ambulatory Visit: Payer: BLUE CROSS/BLUE SHIELD

## 2019-12-30 ENCOUNTER — Other Ambulatory Visit: Payer: Self-pay

## 2019-12-30 ENCOUNTER — Ambulatory Visit (INDEPENDENT_AMBULATORY_CARE_PROVIDER_SITE_OTHER): Payer: BLUE CROSS/BLUE SHIELD

## 2019-12-30 DIAGNOSIS — Z23 Encounter for immunization: Secondary | ICD-10-CM

## 2019-12-30 NOTE — Progress Notes (Signed)
Per orders of Alma Friendly, NP, injection of Shingrix given by Judeth Horn, CMA. Patient tolerated injection well.

## 2020-03-03 ENCOUNTER — Ambulatory Visit (INDEPENDENT_AMBULATORY_CARE_PROVIDER_SITE_OTHER): Payer: BLUE CROSS/BLUE SHIELD | Admitting: Primary Care

## 2020-03-03 ENCOUNTER — Encounter: Payer: Self-pay | Admitting: Primary Care

## 2020-03-03 ENCOUNTER — Other Ambulatory Visit: Payer: Self-pay

## 2020-03-03 DIAGNOSIS — A6000 Herpesviral infection of urogenital system, unspecified: Secondary | ICD-10-CM

## 2020-03-03 DIAGNOSIS — M2560 Stiffness of unspecified joint, not elsewhere classified: Secondary | ICD-10-CM

## 2020-03-03 NOTE — Progress Notes (Signed)
Subjective:    Patient ID: Chip Boer, female    DOB: 06/09/1955, 64 y.o.   MRN: [id number]  HPI  This visit occurred during the SARS-CoV-2 public health emergency.  Safety protocols were in place, including screening questions prior to the visit, additional usage of staff PPE, and extensive cleaning of exam room while observing appropriate contact time as indicated for disinfecting solutions.   Ms. Swartzentruber is a 64 year old female with a history of vertigo, knee pain, GERD who presents today to discuss her right thumb. She also has an active herpes outbreak.  1) Genital Herpes: History of infrequent breakouts that occur typically 6-8 months on average. Last breakout was in June 2021. Yesterday morning after showering she noticed a herpes lesion that is slightly burning. Historically she doesn't take anything for her herpes, she just applies Bacitracin with resolve. She did this yesterday and today.  2) Thumb Stiffness: She's noticed a popping sensation with stiffness to the DIP joint of her thumb that began about two weeks ago. Over the last week she's noticed increased stiffness to the joint when waking during the night. When she rises in the morning her stiffness has improved. The stiffness does not wake her from sleep.  She's not noticed the joint getting stuck. While she's busy working with her hands, she does not notice the stiffness or popping sensation.   She's not taken anything for her symptoms. She denies swelling or redness. She has noticed intermittent discomfort to the metacarpal joint.   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   Review of Systems  Genitourinary:       Genital herpes  Musculoskeletal: Positive for arthralgias. Negative for joint swelling and myalgias.  Skin: Negative for color change.       Past Medical History:  Diagnosis Date  . Migraines   . UTI  (urinary tract infection)      Social History   Socioeconomic History  . Marital status: Married    Spouse name: Not on file  . Number of children: Not on file  . Years of education: Not on file  . Highest education level: Not on file  Occupational History  . Not on file  Tobacco Use  . Smoking status: Never Smoker  . Smokeless tobacco: Never Used  Substance and Sexual Activity  . Alcohol use: No  . Drug use: No  . Sexual activity: Not on file  Other Topics Concern  . Not on file  Social History Narrative   Married.   2 children, 2 grandchildren.   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   Social Determinants of Health   Financial Resource Strain:   . Difficulty of Paying Living Expenses: Not on file  Food Insecurity:   . Worried About Charity fundraiser in the Last Year: Not on file  . Ran Out of Food in the Last Year: Not on file  Transportation Needs:   . Lack of Transportation (Medical): Not on file  . Lack of Transportation (Non-Medical): Not on file  Physical Activity:   . Days of Exercise per Week: Not on file  . Minutes of Exercise per Session: Not on file  Stress:   . Feeling of Stress : Not on file  Social Connections:   . Frequency of Communication with Friends and Family: Not on file  . Frequency  of Social Gatherings with Friends and Family: Not on file  . Attends Religious Services: Not on file  . Active Member of Clubs or Organizations: Not on file  . Attends Archivist Meetings: Not on file  . Marital Status: Not on file  Intimate Partner Violence:   . Fear of Current or Ex-Partner: Not on file  . Emotionally Abused: Not on file  . Physically Abused: Not on file  . Sexually Abused: Not on file    Past Surgical History:  Procedure Laterality Date  . TUBAL LIGATION  1984    Family History  Problem Relation Age of Onset  . Arthritis Mother   . Alzheimer's disease Mother   . Alcohol abuse Father   . Asthma Sister     . Asthma Brother     Allergies  Allergen Reactions  . Codeine     Other reaction(s): Agitation  . Sulfa Antibiotics Hives    Current Outpatient Medications on File Prior to Visit  Medication Sig Dispense Refill  . ALFALFA PO Take by mouth.    . Ascorbic Acid (CHEWABLE VITAMIN C PO) Take 100 mg by mouth.    . B Complex-Biotin-FA (B COMPLETE PO) Take by mouth.    . Cholecalciferol (D3-1000 PO) Take by mouth.    . Misc Natural Products (JOINT SUPPORT COMPLEX PO) Take by mouth.    . Nutritional Supplements (SOY PROTEIN SHAKE) POWD Take by mouth.    Marland Kitchen UNABLE TO FIND Med Name: CarotoMax and Eye Taking 1 capsule by mouth after breakfast daily    . UNABLE TO FIND Herb-Lax Taking 1 tablet by mouth after breakfast daily    . UNABLE TO FIND Med Name: Mind Work    . VITAMIN E PO Take by mouth.    Marland Kitchen ZINC OXIDE PO Take by mouth.     No current facility-administered medications on file prior to visit.    BP 126/78   Pulse (!) 102   Temp (!) 95.9 F (35.5 C) (Temporal)   Ht 5' (1.524 m)   Wt 129 lb (58.5 kg)   SpO2 98%   BMI 25.19 kg/m    Objective:   Physical Exam Musculoskeletal:     Comments: Normal ROM to right first digit. No swelling or erythema to metacarpal or DIP joint.   Skin:    General: Skin is warm and dry.     Findings: No erythema.  Neurological:     Mental Status: She is alert.            Assessment & Plan:

## 2020-03-03 NOTE — Assessment & Plan Note (Signed)
To right first digit x 2 weeks. I did witness the popping sensation but no obvious trigger finger was identified. She does not appear to have inflammation or infection within the joint.  We discussed for her to refrain from activities that exacerbates the symptoms.  Also remain active.  She will update if symptoms progress or do not resolve.  We discussed for her to see our sports medicine doctor for any concern.

## 2020-03-03 NOTE — Assessment & Plan Note (Signed)
Chronic for years, typically treats with bacitracin and experiences resolve.  Continue to monitor.

## 2020-03-03 NOTE — Patient Instructions (Signed)
Refrain from rolling out hard fondant.  Be sure to keep your hand active.  Consider a visit with Dr. Lorelei Pont if your symptoms persist and/or progress.  It was a pleasure to see you today!

## 2020-03-17 ENCOUNTER — Ambulatory Visit: Payer: BLUE CROSS/BLUE SHIELD | Admitting: Dermatology

## 2020-03-17 ENCOUNTER — Encounter: Payer: Self-pay | Admitting: Primary Care

## 2020-04-07 ENCOUNTER — Encounter: Payer: Self-pay | Admitting: Family Medicine

## 2020-04-07 ENCOUNTER — Other Ambulatory Visit: Payer: Self-pay

## 2020-04-07 ENCOUNTER — Ambulatory Visit (INDEPENDENT_AMBULATORY_CARE_PROVIDER_SITE_OTHER): Payer: Medicare Other | Admitting: Family Medicine

## 2020-04-07 VITALS — BP 120/68 | HR 98 | Temp 98.0°F | Ht 60.0 in | Wt 128.8 lb

## 2020-04-07 DIAGNOSIS — M65311 Trigger thumb, right thumb: Secondary | ICD-10-CM | POA: Diagnosis not present

## 2020-04-07 NOTE — Progress Notes (Signed)
    Isaia Hassell T. Adaly Puder, MD, [facility] [place] at [facility] [place], [address]  Phone: [phone]  FAX: [phone]  Kelly James - 64 y.o. female  MRN [id number]  Date of Birth: Sep 21, 1955  Date: 04/07/2020  PCP: Pleas Koch, NP  Referral: Pleas Koch, NP  Chief Complaint  Patient presents with  . Trigger Finger    Right Thumb    This visit occurred during the SARS-CoV-2 public health emergency.  Safety protocols were in place, including screening questions prior to the visit, additional usage of staff PPE, and extensive cleaning of exam room while observing appropriate contact time as indicated for disinfecting solutions.    Total encounter time: 10 minutes. This includes total time spent on the day of encounter.  She is here today for further evaluation and treatment by Mrs. Clark and she does have a right-sided obvious trigger thumb.  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  At this point, she would like to just observe this, and I think this is reasonable.  At any point if she wants to have a trigger thumb injection, that I am happy to do this for her.    ICD-10-CM   1. Trigger thumb, right thumb  M65.311    Signed,  Emma Schupp T. Jessika Rothery, MD

## 2020-05-20 DIAGNOSIS — R3989 Other symptoms and signs involving the genitourinary system: Secondary | ICD-10-CM

## 2020-05-23 NOTE — Telephone Encounter (Signed)
Joellen, can you put these immunizations in?

## 2020-05-24 NOTE — Telephone Encounter (Signed)
All immunizations are in chart.

## 2020-05-31 ENCOUNTER — Other Ambulatory Visit: Payer: Self-pay

## 2020-05-31 ENCOUNTER — Other Ambulatory Visit (INDEPENDENT_AMBULATORY_CARE_PROVIDER_SITE_OTHER): Payer: Medicare Other

## 2020-05-31 DIAGNOSIS — R3989 Other symptoms and signs involving the genitourinary system: Secondary | ICD-10-CM | POA: Diagnosis not present

## 2020-06-05 LAB — HSV 1/2 AB (IGM), IFA W/RFLX TITER
HSV 1 IgM Screen: NEGATIVE
HSV 2 IgM Screen: NEGATIVE

## 2020-06-05 LAB — HSV(HERPES SIMPLEX VRS) I + II AB-IGG
HAV 1 IGG,TYPE SPECIFIC AB: 35.7 index — ABNORMAL HIGH
HSV 2 IGG,TYPE SPECIFIC AB: 7.75 index — ABNORMAL HIGH

## 2020-06-27 ENCOUNTER — Other Ambulatory Visit: Payer: Self-pay

## 2020-06-27 ENCOUNTER — Ambulatory Visit (INDEPENDENT_AMBULATORY_CARE_PROVIDER_SITE_OTHER): Payer: Medicare Other | Admitting: Dermatology

## 2020-06-27 DIAGNOSIS — D1800 Hemangioma unspecified site: Secondary | ICD-10-CM

## 2020-06-27 DIAGNOSIS — Z86018 Personal history of other benign neoplasm: Secondary | ICD-10-CM

## 2020-06-27 DIAGNOSIS — L239 Allergic contact dermatitis, unspecified cause: Secondary | ICD-10-CM

## 2020-06-27 DIAGNOSIS — L814 Other melanin hyperpigmentation: Secondary | ICD-10-CM

## 2020-06-27 DIAGNOSIS — Z1283 Encounter for screening for malignant neoplasm of skin: Secondary | ICD-10-CM

## 2020-06-27 DIAGNOSIS — D229 Melanocytic nevi, unspecified: Secondary | ICD-10-CM

## 2020-06-27 DIAGNOSIS — L821 Other seborrheic keratosis: Secondary | ICD-10-CM

## 2020-06-27 DIAGNOSIS — L578 Other skin changes due to chronic exposure to nonionizing radiation: Secondary | ICD-10-CM

## 2020-06-27 NOTE — Progress Notes (Signed)
° °  Follow-Up Visit   Subjective  Kelly James is a 65 y.o. female who presents for the following: Annual Exam (History of atypical nevus - TBSE today). The patient presents for Total-Body Skin Exam (TBSE) for skin cancer screening and mole check.  The following portions of the chart were reviewed this encounter and updated as appropriate:   Tobacco   Allergies   Meds   Problems   Med Hx   Surg Hx   Fam Hx      Review of Systems:  No other skin or systemic complaints except as noted in HPI or Assessment and Plan.  Objective  Well appearing patient in no apparent distress; mood and affect are within normal limits.  A full examination was performed including scalp, head, eyes, ears, nose, lips, neck, chest, axillae, abdomen, back, buttocks, bilateral upper extremities, bilateral lower extremities, hands, feet, fingers, toes, fingernails, and toenails. All findings within normal limits unless otherwise noted below.  Objective  Scalp: Clear   Assessment & Plan    History of Dysplastic Nevi - No evidence of recurrence today - Recommend regular full body skin exams - Recommend daily broad spectrum sunscreen SPF 30+ to sun-exposed areas, reapply every 2 hours as needed.  - Call if any new or changing lesions are noted between office visits  Lentigines - Scattered tan macules - Discussed due to sun exposure - Benign, observe - Call for any changes  Seborrheic Keratoses - Stuck-on, waxy, tan-brown papules and plaques  - Discussed benign etiology and prognosis. - Observe - Call for any changes  Melanocytic Nevi - Tan-brown and/or pink-flesh-colored symmetric macules and papules - Benign appearing on exam today - Observation - Call clinic for new or changing moles - Recommend daily use of broad spectrum spf 30+ sunscreen to sun-exposed areas.   Hemangiomas - Red papules - Discussed benign nature - Observe - Call for any changes  Actinic Damage - Chronic, secondary to  cumulative UV/sun exposure - diffuse scaly erythematous macules with underlying dyspigmentation - Recommend daily broad spectrum sunscreen SPF 30+ to sun-exposed areas, reapply every 2 hours as needed.  - Call for new or changing lesions.  Skin cancer screening performed today.  Allergic dermatitis Scalp With pruritus - persistent with flares Patient attributes itching to sulfates in shampoo and conditioner. Recommend she try Free and Clear Shampoo and Conditioner - samples given. Consider PatchTesting (True Test x 36) in future. Consider Rx for symptoms.  Pt declines today.  Return in about 1 year (around 06/27/2021) for TBSE.  I, Ashok Cordia, CMA, am acting as scribe for Sarina Ser, MD .  Documentation: I have reviewed the above documentation for accuracy and completeness, and I agree with the above.  Sarina Ser, MD

## 2020-06-28 ENCOUNTER — Encounter: Payer: Self-pay | Admitting: Dermatology

## 2020-09-28 DIAGNOSIS — Z20822 Contact with and (suspected) exposure to covid-19: Secondary | ICD-10-CM | POA: Diagnosis not present

## 2020-10-21 ENCOUNTER — Ambulatory Visit (INDEPENDENT_AMBULATORY_CARE_PROVIDER_SITE_OTHER): Payer: Medicare Other | Admitting: Primary Care

## 2020-10-21 ENCOUNTER — Other Ambulatory Visit: Payer: Self-pay

## 2020-10-21 ENCOUNTER — Encounter: Payer: Self-pay | Admitting: Primary Care

## 2020-10-21 DIAGNOSIS — R519 Headache, unspecified: Secondary | ICD-10-CM | POA: Diagnosis not present

## 2020-10-21 NOTE — Patient Instructions (Signed)
Please notify me if your headaches return.   It was a pleasure to see you today!

## 2020-10-21 NOTE — Assessment & Plan Note (Signed)
New daily headaches that are relieved temporarily with Tylenol Arthritis.   No headache today. Neuro exam today unremarkable.  She kindly declines treatment today given that her headache is gone. We agreed that if her headache returns she will call and we will proceed with a Toradol IM 60 mg.   Consider imaging if headaches do return.

## 2020-10-21 NOTE — Progress Notes (Signed)
Subjective:    Patient ID: Kelly James, female    DOB: 1956-02-08, 65 y.o.   MRN: [id number]  HPI  Kelly James is a very pleasant 65 y.o. female with a history of vertigo, GERD who presents today to discuss headaches.   Headaches began about 4-5 weeks ago for which are daily and located to the frontal lobes and occipital lobes. Over the last several days her headaches have become intense.   She's been taking Tylenol Arthritis which has helped some. She's also tried Sinus and Cold medication without improvement.    History of frequent headaches during her younger years, has had one migraine that she can remember. She does endorse increased stress recently.   She denies neck pain, photophobia, phonophobia, dizziness, visual changes, and nausea. She saw her eye doctor last week, stable exam. She actually has no headache today.   She had a negative Covid-19 test on 04/27, symptoms started just after her test.   BP Readings from Last 3 Encounters:  10/21/20 126/82  04/07/20 120/68  03/03/20 126/78      Review of Systems  Eyes: Negative for photophobia.  Gastrointestinal: Negative for nausea.  Neurological: Positive for headaches.         Past Medical History:  Diagnosis Date  . Atypical mole 09/10/2019   L ant thigh near the knee   . Migraines   . UTI (urinary tract infection)     Social History   Socioeconomic History  . Marital status: Married    Spouse name: Not on file  . Number of children: Not on file  . Years of education: Not on file  . Highest education level: Not on file  Occupational History  . Not on file  Tobacco Use  . Smoking status: Never Smoker  . Smokeless tobacco: Never Used  Substance and Sexual Activity  . Alcohol use: No  . Drug use: No  . Sexual activity: Not on file  Other Topics Concern  . Not on file  Social History Narrative   Married.   2 children, 2 grandchildren.   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    Social Determinants of Health   Financial Resource Strain: Not on file  Food Insecurity: Not on file  Transportation Needs: Not on file  Physical Activity: Not on file  Stress: Not on file  Social Connections: Not on file  Intimate Partner Violence: Not on file    Past Surgical History:  Procedure Laterality Date  . TUBAL LIGATION  1984    Family History  Problem Relation Age of Onset  . Arthritis Mother   . Alzheimer's disease Mother   . Alcohol abuse Father   . Asthma Sister   . Asthma Brother     Allergies  Allergen Reactions  . Codeine     Other reaction(s): Agitation  . Sulfa Antibiotics Hives    Current Outpatient Medications on File Prior to Visit  Medication Sig Dispense Refill  . ALFALFA PO Take by mouth.    . Ascorbic Acid (CHEWABLE VITAMIN C PO) Take 100 mg by mouth.    . B Complex-Biotin-FA (B COMPLETE PO) Take by mouth.    Marland Kitchen CALCIUM PO Take 1,560 mg by mouth daily.    . Cholecalciferol (D3-1000 PO) Take by mouth.    . Misc Natural Products (JOINT SUPPORT COMPLEX PO) Take by mouth.    . Nutritional Supplements (NUTRITIONAL SUPPLEMENT PO) Take by mouth. Stress Relief Complex-One tablet daily    .  Nutritional Supplements (SOY PROTEIN SHAKE) POWD Take by mouth.    . TURMERIC PO Take by mouth.    Marland Kitchen UNABLE TO FIND Med Name: CarotoMax and Eye Taking 1 capsule by mouth after breakfast daily    . UNABLE TO FIND Herb-Lax Taking 1 tablet by mouth after breakfast daily    . UNABLE TO FIND Med Name: Mind Work    . VITAMIN E PO Take by mouth.    Marland Kitchen ZINC OXIDE PO Take by mouth.    Marland Kitchen acyclovir (ZOVIRAX) 400 MG tablet Take 400 mg by mouth 2 (two) times daily.    Marland Kitchen neomycin-polymyxin-dexamethasone (MAXITROL) 0.1 % ophthalmic suspension neomycin-polymyxin-dexameth 3.5 mg/mL-10,000 unit/mL-0.1% eye drops  APPLY 1 DROP IN THE AFFECTED EYE FOUR TIMES DAILY X 10 DAYS     No current facility-administered medications on file prior to visit.    BP 126/82   Pulse 100    Temp 98.6 F (37 C) (Temporal)   Ht 5' (1.524 m)   Wt 133 lb (60.3 kg)   SpO2 99%   BMI 25.97 kg/m  Objective:   Physical Exam Eyes:     Extraocular Movements: Extraocular movements intact.  Pulmonary:     Effort: Pulmonary effort is normal.  Skin:    General: Skin is warm and dry.  Neurological:     Mental Status: She is alert and oriented to person, place, and time.     Cranial Nerves: No cranial nerve deficit.           Assessment & Plan:      This visit occurred during the SARS-CoV-2 public health emergency.  Safety protocols were in place, including screening questions prior to the visit, additional usage of staff PPE, and extensive cleaning of exam room while observing appropriate contact time as indicated for disinfecting solutions.

## 2020-10-25 NOTE — Telephone Encounter (Signed)
Kelly James, she needs an injection of Toradol 60 mg IM. Can you arrange this for her? Dx: frequent headaches

## 2020-11-09 ENCOUNTER — Other Ambulatory Visit: Payer: Self-pay

## 2020-11-09 ENCOUNTER — Ambulatory Visit (INDEPENDENT_AMBULATORY_CARE_PROVIDER_SITE_OTHER): Payer: Medicare Other

## 2020-11-09 DIAGNOSIS — R519 Headache, unspecified: Secondary | ICD-10-CM | POA: Diagnosis not present

## 2020-11-09 MED ORDER — KETOROLAC TROMETHAMINE 60 MG/2ML IM SOLN
60.0000 mg | Freq: Once | INTRAMUSCULAR | Status: AC
  Administered 2020-11-09: 60 mg via INTRAMUSCULAR

## 2020-11-09 NOTE — Progress Notes (Signed)
Per orders of Allie Bossier, injection of Toradol 60 mg, given by Loreen Freud. Patient tolerated injection well.

## 2020-11-16 DIAGNOSIS — G4452 New daily persistent headache (NDPH): Secondary | ICD-10-CM

## 2020-11-16 DIAGNOSIS — R519 Headache, unspecified: Secondary | ICD-10-CM

## 2020-11-18 ENCOUNTER — Encounter: Payer: Self-pay | Admitting: *Deleted

## 2020-11-29 ENCOUNTER — Other Ambulatory Visit: Payer: Self-pay

## 2020-11-29 ENCOUNTER — Ambulatory Visit
Admission: RE | Admit: 2020-11-29 | Discharge: 2020-11-29 | Disposition: A | Discharge status: Home / Self Care | Payer: Medicare Other | Source: Ambulatory Visit | Attending: Primary Care | Admitting: Primary Care

## 2020-11-29 DIAGNOSIS — G4452 New daily persistent headache (NDPH): Secondary | ICD-10-CM | POA: Diagnosis present

## 2020-11-29 DIAGNOSIS — R519 Headache, unspecified: Secondary | ICD-10-CM | POA: Diagnosis not present

## 2020-11-30 DIAGNOSIS — G4452 New daily persistent headache (NDPH): Secondary | ICD-10-CM

## 2020-11-30 DIAGNOSIS — R9089 Other abnormal findings on diagnostic imaging of central nervous system: Secondary | ICD-10-CM

## 2020-12-08 ENCOUNTER — Encounter: Payer: Self-pay | Admitting: Neurology

## 2020-12-08 ENCOUNTER — Ambulatory Visit (INDEPENDENT_AMBULATORY_CARE_PROVIDER_SITE_OTHER): Payer: Medicare Other | Admitting: Neurology

## 2020-12-08 VITALS — BP 143/88 | HR 94 | Ht 60.0 in | Wt 133.0 lb

## 2020-12-08 DIAGNOSIS — G4489 Other headache syndrome: Secondary | ICD-10-CM | POA: Diagnosis not present

## 2020-12-08 NOTE — Progress Notes (Signed)
Reason for visit: Headache  Referring physician: Dr. [name] is a 65 y.o. female  History of present illness:  Kelly James is a 65 year old right-handed white female with a history of migraine headache when she was in her teens and 41s.  The patient claims at that point she went on supplements and her headaches went away and she has not been bothered with headaches throughout her life.  Sometime in mid May 2022 however, she began having headaches again that became daily.  The patient reports that headaches could be in the front or back of the head, on top of the head or on one side or the other.  She denies any photophobia or phonophobia or nausea or vomiting with a headache.  The headache would respond to some degree with Tylenol.  She reported some cognitive clouding when the headache came on.  She does have occasional neck discomfort but this is not a significant issue for her.  She denies any numbness or weakness of the extremities or balance changes or difficulty controlling the bowels or the bladder.  She underwent MRI of the brain that was relatively unremarkable.  Since the MRI was done, over the last week or so her headaches have dissipated.  She comes here for further evaluation.  She reports that she is followed by her eye doctor with some elevation in intraocular pressures in the 24 or 25 range.  She does report she is under some stress at home.  Past Medical History:  Diagnosis Date   Atypical mole 09/10/2019   L ant thigh near the knee    Migraines    UTI (urinary tract infection)     Past Surgical History:  Procedure Laterality Date   TUBAL LIGATION  1984    Family History  Problem Relation Age of Onset   Arthritis Mother    Alzheimer's disease Mother    Alcohol abuse Father    Asthma Sister    Asthma Brother     Social history:  reports that she has never smoked. She has never used smokeless tobacco. She reports that she does not drink alcohol and does  not use drugs.  Medications:  Prior to Admission medications   Medication Sig Start Date End Date Taking? Authorizing Provider  acyclovir (ZOVIRAX) 400 MG tablet Take 400 mg by mouth 2 (two) times daily. 09/15/20  Yes [provider]  ALFALFA PO Take by mouth.   Yes [provider]  Ascorbic Acid (CHEWABLE VITAMIN C PO) Take 100 mg by mouth.   Yes [provider]  B Complex-Biotin-FA (B COMPLETE PO) Take by mouth.   Yes [provider]  CALCIUM PO Take 1,560 mg by mouth daily.   Yes [provider]  Cholecalciferol (D3-1000 PO) Take by mouth.   Yes [provider]  Misc Natural Products (JOINT SUPPORT COMPLEX PO) Take by mouth.   Yes [provider]  neomycin-polymyxin-dexamethasone (MAXITROL) 0.1 % ophthalmic suspension neomycin-polymyxin-dexameth 3.5 mg/mL-10,000 unit/mL-0.1% eye drops  APPLY 1 DROP IN THE AFFECTED EYE FOUR TIMES DAILY X 10 DAYS   Yes [provider]  Nutritional Supplements (NUTRITIONAL SUPPLEMENT PO) Take by mouth. Stress Relief Complex-One tablet daily   Yes [provider]  Nutritional Supplements (SOY PROTEIN SHAKE) POWD Take by mouth.   Yes [provider]  Omega-3 Fatty Acids (FISH OIL) 1000 MG CAPS Take by mouth.   Yes [provider]  TURMERIC PO Take by mouth.   Yes  [provider]  UNABLE TO FIND Med Name: CarotoMax and Eye Taking 1 capsule by mouth after breakfast daily   Yes [provider]  UNABLE TO FIND Herb-Lax Taking 1 tablet by mouth after breakfast daily   Yes [provider]  UNABLE TO FIND Med Name: Mind Work   Yes [provider]  VITAMIN E PO Take by mouth.   Yes [provider]  ZINC OXIDE PO Take by mouth.   Yes [provider]      Allergies  Allergen Reactions   Codeine     Other reaction(s): Agitation   Sulfa Antibiotics Hives    ROS:  Out of a complete 14 system review of symptoms,  the patient complains only of the following symptoms, and all other reviewed systems are negative.  Headache  Height 5' (1.524 m), weight 133 lb (60.3 kg).  Physical Exam  General: The patient is alert and cooperative at the time of the examination.  Eyes: Pupils are equal, round, and reactive to light. Discs are flat bilaterally.  Neck: The neck is supple, no carotid bruits are noted.  Respiratory: The respiratory examination is clear.  Cardiovascular: The cardiovascular examination reveals a regular rate and rhythm, no obvious murmurs or rubs are noted.  Neuromuscular: Range of movement of the cervical spine was normal.  No crepitus is noted in the temporomandibular joints.  Skin: Extremities are without significant edema.  Neurologic Exam  Mental status: The patient is alert and oriented x 3 at the time of the examination. The patient has apparent normal recent and remote memory, with an apparently normal attention span and concentration ability.  Cranial nerves: Facial symmetry is present. There is good sensation of the face to pinprick and soft touch bilaterally. The strength of the facial muscles and the muscles to head turning and shoulder shrug are normal bilaterally. Speech is well enunciated, no aphasia or dysarthria is noted. Extraocular movements are full. Visual fields are full. The tongue is midline, and the patient has symmetric elevation of the soft palate. No obvious hearing deficits are noted.  Motor: The motor testing reveals 5 over 5 strength of all 4 extremities. Good symmetric motor tone is noted throughout.  Sensory: Sensory testing is intact to pinprick, soft touch, vibration sensation, and position sense on all 4 extremities. No evidence of extinction is noted.  Coordination: Cerebellar testing reveals good finger-nose-finger and heel-to-shin bilaterally.  Gait and station: Gait is normal. Tandem gait is normal. Romberg is negative. No drift is  seen.  Reflexes: Deep tendon reflexes are symmetric and normal bilaterally. Toes are downgoing bilaterally.   MRI brain 11/29/20:  IMPRESSION: No evidence of acute intracranial abnormality.   Mild multifocal T2/FLAIR hyperintensity within the cerebral white matter, nonspecific but most often secondary to chronic small vessel ischemia.   2 mm chronic microhemorrhage within the right parietal lobe.   Suspected 1.3 cm incidental arachnoid cyst overlying the anterior right frontal lobe.   Otherwise unremarkable noncontrast MRI appearance of the brain.  * MRI scan images were reviewed online. I agree with the written report.   Assessment/Plan:  1.  Headache, new onset  The patient does have a prior history of migraine when she was younger, but most of her life she has not had headaches.  Given the new onset of headache, I will check blood work to include a sedimentation rate and CRP.  The MRI of the brain is relatively unremarkable, and the patient reports that her headaches are  going away.  I would not initiate medications at this time, the patient will contact our office if the headaches come back.  Otherwise, she can be seen on an as-needed basis.  Kelly Alexanders MD 12/08/2020 3:32 PM  [facility] [address] [place], [place] [address]  Phone [phone] Fax [phone]

## 2020-12-09 LAB — C-REACTIVE PROTEIN: CRP: <1 mg/L (ref 0–10)

## 2020-12-09 LAB — SEDIMENTATION RATE: Sed Rate: 2 mm/hr (ref 0–40)

## 2021-05-02 ENCOUNTER — Ambulatory Visit (INDEPENDENT_AMBULATORY_CARE_PROVIDER_SITE_OTHER): Payer: Medicare Other | Admitting: Primary Care

## 2021-05-02 ENCOUNTER — Encounter: Payer: Self-pay | Admitting: Primary Care

## 2021-05-02 ENCOUNTER — Other Ambulatory Visit: Payer: Self-pay

## 2021-05-02 DIAGNOSIS — R030 Elevated blood-pressure reading, without diagnosis of hypertension: Secondary | ICD-10-CM | POA: Diagnosis not present

## 2021-05-02 DIAGNOSIS — A6000 Herpesviral infection of urogenital system, unspecified: Secondary | ICD-10-CM

## 2021-05-02 DIAGNOSIS — R519 Headache, unspecified: Secondary | ICD-10-CM | POA: Diagnosis not present

## 2021-05-02 DIAGNOSIS — R21 Rash and other nonspecific skin eruption: Secondary | ICD-10-CM

## 2021-05-02 NOTE — Progress Notes (Signed)
Subjective:    Patient ID: Kelly James, female    DOB: July 05, 1955, 65 y.o.   MRN: [id number]  HPI  Kelly James is a very pleasant 65 y.o. female with a history of vertigo, hyperlipidemia, frequent headaches, joint stiffness who presents today to discuss rash and for follow up.  1) Rash: Her rash was located to the mid lower back with radiation around to bilateral hips that began about one week ago, lasted for about 4 days, resolved two days ago. Also with rash to right upper chest under breast that lasted for one day, also itching behind her ears where her glasses lay.  The rash was itchy, constantly itchy. She did change her dryer sheets prior to her rash and itching. She is using these dryer sheets but has cutting them to 1/2 sheet. She's applied Eucerin lotion and Rx cream provided by her dermatologist previously.   She's been using Aveno Oat Milk shampoo and conditioner rather than her typical Suave. Has noticed improvement in itching behind her ears.   No rash or itching for 2-3 days, has no complaints now.   2) Genital Herpes: Chronic, typically treats with bacitracin. Is now managed on acyclovir 400 mg PRN. Outbreaks occurring every 6-8 months.   3) Elevated Blood Pressure: Noted in office today, also with elevated reading in July 2022. She checked her BP a few days ago at home which was 123/67.   Frequent headaches have improved. Evaluated by neurology in July 2022, MRI brain in June 2022.  Review of Systems  Respiratory:  Negative for shortness of breath.   Cardiovascular:  Negative for chest pain.  Skin:  Negative for color change and rash.       See HPI        Past Medical History:  Diagnosis Date   Atypical mole 09/10/2019   L ant thigh near the knee    Migraines    UTI (urinary tract infection)     Social History   Socioeconomic History   Marital status: Married    Spouse name: Not on file   Number of children: Not on file   Years of education: Not on  file   Highest education level: Not on file  Occupational History   Not on file  Tobacco Use   Smoking status: Never   Smokeless tobacco: Never  Substance and Sexual Activity   Alcohol use: No   Drug use: No   Sexual activity: Not on file  Other Topics Concern   Not on file  Social History Narrative   Married.   2 children, 2 grandchildren.   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   Social Determinants of Health   Financial Resource Strain: Not on file  Food Insecurity: Not on file  Transportation Needs: Not on file  Physical Activity: Not on file  Stress: Not on file  Social Connections: Not on file  Intimate Partner Violence: Not on file    Past Surgical History:  Procedure Laterality Date   TUBAL LIGATION  1984    Family History  Problem Relation Age of Onset   Arthritis Mother    Alzheimer's disease Mother    Alcohol abuse Father    Asthma Sister    Asthma Brother     Allergies  Allergen Reactions   Codeine     Other reaction(s): Agitation   Sulfa Antibiotics Hives    Current Outpatient Medications on File Prior to Visit  Medication Sig  Dispense Refill   acyclovir (ZOVIRAX) 400 MG tablet Take 400 mg by mouth 2 (two) times daily.     ALFALFA PO Take by mouth.     Ascorbic Acid (CHEWABLE VITAMIN C PO) Take 100 mg by mouth.     B Complex-Biotin-FA (B COMPLETE PO) Take by mouth.     CALCIUM PO Take 1,560 mg by mouth daily.     Cholecalciferol (D3-1000 PO) Take by mouth.     Misc Natural Products (JOINT SUPPORT COMPLEX PO) Take by mouth.     neomycin-polymyxin-dexamethasone (MAXITROL) 0.1 % ophthalmic suspension neomycin-polymyxin-dexameth 3.5 mg/mL-10,000 unit/mL-0.1% eye drops  APPLY 1 DROP IN THE AFFECTED EYE FOUR TIMES DAILY X 10 DAYS     Nutritional Supplements (NUTRITIONAL SUPPLEMENT PO) Take by mouth. Stress Relief Complex-One tablet daily     Nutritional Supplements (SOY PROTEIN SHAKE) POWD Take by mouth.     Omega-3 Fatty Acids  (FISH OIL) 1000 MG CAPS Take by mouth.     TURMERIC PO Take by mouth.     UNABLE TO FIND Med Name: CarotoMax and Eye Taking 1 capsule by mouth after breakfast daily     UNABLE TO FIND Herb-Lax Taking 1 tablet by mouth after breakfast daily     UNABLE TO FIND Med Name: Mind Work     VITAMIN E PO Take by mouth.     ZINC OXIDE PO Take by mouth.     No current facility-administered medications on file prior to visit.    BP 126/62   Pulse 82   Temp 97.6 F (36.4 C) (Temporal)   Ht 5' (1.524 m)   Wt 132 lb (59.9 kg)   SpO2 98%   BMI 25.78 kg/m  Objective:   Physical Exam Cardiovascular:     Rate and Rhythm: Normal rate and regular rhythm.  Pulmonary:     Effort: Pulmonary effort is normal.     Breath sounds: Normal breath sounds.  Musculoskeletal:     Cervical back: Neck supple.  Skin:    General: Skin is warm and dry.     Findings: No erythema or rash.          Assessment & Plan:      This visit occurred during the SARS-CoV-2 public health emergency.  Safety protocols were in place, including screening questions prior to the visit, additional usage of staff PPE, and extensive cleaning of exam room while observing appropriate contact time as indicated for disinfecting solutions.

## 2021-05-02 NOTE — Assessment & Plan Note (Signed)
Improved, evaluated by Neurology in July 2022, MRI brain in June 2022. Notes and imaging reviewed today.  Continue to monitor.

## 2021-05-02 NOTE — Assessment & Plan Note (Signed)
Chronic, stable.  No recent use of acyclovir 400 mg.   Continue same.

## 2021-05-02 NOTE — Assessment & Plan Note (Signed)
Resolved a few days ago. No rash noted today.   Continue Eucerin lotions and PRN Rx cream.

## 2021-05-02 NOTE — Patient Instructions (Signed)
Monitor your blood pressure at home on occasion. It should run less than 130 on top and less than 90 bottom.   It was a pleasure to see you today!

## 2021-05-02 NOTE — Assessment & Plan Note (Addendum)
Noted today and also during a visit from July 2022. Recent home reading stable, discussed to monitor at home on occasion.  Repeat BP improved on recheck.

## 2021-06-13 DIAGNOSIS — N958 Other specified menopausal and perimenopausal disorders: Secondary | ICD-10-CM | POA: Diagnosis not present

## 2021-06-13 DIAGNOSIS — M816 Localized osteoporosis [Lequesne]: Secondary | ICD-10-CM | POA: Diagnosis not present

## 2021-06-28 ENCOUNTER — Ambulatory Visit (INDEPENDENT_AMBULATORY_CARE_PROVIDER_SITE_OTHER): Payer: Medicare Other | Admitting: Dermatology

## 2021-06-28 ENCOUNTER — Other Ambulatory Visit: Payer: Self-pay

## 2021-06-28 DIAGNOSIS — D229 Melanocytic nevi, unspecified: Secondary | ICD-10-CM

## 2021-06-28 DIAGNOSIS — Z1283 Encounter for screening for malignant neoplasm of skin: Secondary | ICD-10-CM

## 2021-06-28 DIAGNOSIS — Z86018 Personal history of other benign neoplasm: Secondary | ICD-10-CM

## 2021-06-28 DIAGNOSIS — L821 Other seborrheic keratosis: Secondary | ICD-10-CM | POA: Diagnosis not present

## 2021-06-28 DIAGNOSIS — D1800 Hemangioma unspecified site: Secondary | ICD-10-CM | POA: Diagnosis not present

## 2021-06-28 DIAGNOSIS — L578 Other skin changes due to chronic exposure to nonionizing radiation: Secondary | ICD-10-CM | POA: Diagnosis not present

## 2021-06-28 DIAGNOSIS — L814 Other melanin hyperpigmentation: Secondary | ICD-10-CM | POA: Diagnosis not present

## 2021-06-28 NOTE — Patient Instructions (Signed)

## 2021-06-28 NOTE — Progress Notes (Signed)
° °  Follow-Up Visit   Subjective  Kelly James is a 66 y.o. female who presents for the following: Annual Exam (Mole check ). Yearly mole check, hx of Dysplastic nevus.  The patient presents for Total-Body Skin Exam (TBSE) for skin cancer screening and mole check.  The patient has spots, moles and lesions to be evaluated, some may be new or changing and the patient has concerns that these could be cancer.   The following portions of the chart were reviewed this encounter and updated as appropriate:   Tobacco   Allergies   Meds   Problems   Med Hx   Surg Hx   Fam Hx      Review of Systems:  No other skin or systemic complaints except as noted in HPI or Assessment and Plan.  Objective  Well appearing patient in no apparent distress; mood and affect are within normal limits.  A full examination was performed including scalp, head, eyes, ears, nose, lips, neck, chest, axillae, abdomen, back, buttocks, bilateral upper extremities, bilateral lower extremities, hands, feet, fingers, toes, fingernails, and toenails. All findings within normal limits unless otherwise noted below.   Assessment & Plan  Skin cancer screening   Lentigines - Scattered tan macules - Due to sun exposure - Benign-appearing, observe - Recommend daily broad spectrum sunscreen SPF 30+ to sun-exposed areas, reapply every 2 hours as needed. - Call for any changes  Seborrheic Keratoses - Stuck-on, waxy, tan-brown papules and/or plaques  - Benign-appearing - Discussed benign etiology and prognosis. - Observe - Call for any changes  Melanocytic Nevi - Tan-brown and/or pink-flesh-colored symmetric macules and papules - Benign appearing on exam today - Observation - Call clinic for new or changing moles - Recommend daily use of broad spectrum spf 30+ sunscreen to sun-exposed areas.   Hemangiomas - Red papules - Discussed benign nature - Observe - Call for any changes  Actinic Damage - Chronic condition, secondary  to cumulative UV/sun exposure - diffuse scaly erythematous macules with underlying dyspigmentation - Recommend daily broad spectrum sunscreen SPF 30+ to sun-exposed areas, reapply every 2 hours as needed.  - Staying in the shade or wearing long sleeves, sun glasses (UVA+UVB protection) and wide brim hats (4-inch brim around the entire circumference of the hat) are also recommended for sun protection.  - Call for new or changing lesions.  History of Dysplastic Nevi Left anterior thigh near the knee 09/10/2019 - No evidence of recurrence today - Recommend regular full body skin exams - Recommend daily broad spectrum sunscreen SPF 30+ to sun-exposed areas, reapply every 2 hours as needed.  - Call if any new or changing lesions are noted between office visits   Skin cancer screening performed today.   Return in about 1 year (around 06/28/2022) for TBSE, Hx of Dysplastic nevus .  IMarye Round, CMA, am acting as scribe for Sarina Ser, MD .  Documentation: I have reviewed the above documentation for accuracy and completeness, and I agree with the above.  Sarina Ser, MD

## 2021-07-02 ENCOUNTER — Encounter: Payer: Self-pay | Admitting: Dermatology

## 2021-07-04 DIAGNOSIS — H2513 Age-related nuclear cataract, bilateral: Secondary | ICD-10-CM | POA: Diagnosis not present

## 2021-07-05 ENCOUNTER — Encounter: Payer: Self-pay | Admitting: Nurse Practitioner

## 2021-07-05 ENCOUNTER — Telehealth (INDEPENDENT_AMBULATORY_CARE_PROVIDER_SITE_OTHER): Payer: Medicare Other | Admitting: Nurse Practitioner

## 2021-07-05 ENCOUNTER — Other Ambulatory Visit: Payer: Medicare Other

## 2021-07-05 VITALS — Temp 98.9°F

## 2021-07-05 DIAGNOSIS — R509 Fever, unspecified: Secondary | ICD-10-CM | POA: Diagnosis not present

## 2021-07-05 DIAGNOSIS — R051 Acute cough: Secondary | ICD-10-CM | POA: Diagnosis not present

## 2021-07-05 DIAGNOSIS — R5383 Other fatigue: Secondary | ICD-10-CM | POA: Diagnosis not present

## 2021-07-05 NOTE — Assessment & Plan Note (Signed)
Can use over-the-counter analgesics as needed.  Pending PCR COVID test

## 2021-07-05 NOTE — Telephone Encounter (Signed)
I spoke with pt; pt said had negative home covid test on 07/05/21. Symptoms started with dry cough for over one month. Starting on 07/04/21 cough is different and seems more "intense" pts temp this morning was 99.5. pt has runny nose only when she sneezes. Pt does have scratchy throat. On 07/04/21 pt had H/a with pain level of 1. Pt does not have SOB but pt has not been up and around since yesterday; mainly resting in bed. No diarrhea or vomiting, no wheezing or CP. Pt scheduled VV with Romilda Garret NP 07/05/21 at 11:20. Am. Pt will have vitals ready when CMA calls. UC & ED precautions given and pt voiced understanding. Sending note to Romilda Garret NP and Anastasiya CMA.

## 2021-07-05 NOTE — Telephone Encounter (Signed)
Noted. Will evaluated at virtual visit.

## 2021-07-05 NOTE — Assessment & Plan Note (Signed)
Pending send out COVID test.

## 2021-07-05 NOTE — Progress Notes (Signed)
Patient ID: Kelly James, female    DOB: 02/28/1956, 66 y.o.   MRN: [id number]  Virtual visit completed through [employer], a video enabled telemedicine application. Due to national recommendations of social distancing due to COVID-19, a virtual visit is felt to be most appropriate for this patient at this time. Reviewed limitations, risks, security and privacy concerns of performing a virtual visit and the availability of in person appointments. I also reviewed that there may be a patient responsible charge related to this service. The patient agreed to proceed.   Patient location: home Provider location: [place] [facility] [place], office Persons participating in this virtual visit: patient, provider   If any vitals were documented, they were collected by patient at home unless specified below.    Temp 98.9 F (37.2 C) Comment: per patient   CC:  cough Subjective:   HPI: Kelly James is a 66 y.o. female presenting on 07/05/2021 for Cough (Present for about a month, off and on, wet cough but not much is been coughed up, gets tickle in her throat, "feeling louzy". No sore throat. Covid test negative on 07/05/21.)   Cough that has been intermittent for about a month. States it comes and goes. States she went to [facility] doctor yesterday and when she came home and the cough changed. States that it is more of a "rough" cough.  Covid test this morning that was negative Pfizer x2 and 3 boosters No sick contacts that she is aware of  No OTC medications   Relevant past medical, surgical, family and social history reviewed and updated as indicated. Interim medical history since our last visit reviewed. Allergies and medications reviewed and updated. Outpatient Medications Prior to Visit  Medication Sig Dispense Refill   acyclovir (ZOVIRAX) 400 MG tablet Take 400 mg by mouth 2 (two) times daily.     ALFALFA PO Take by mouth.     Ascorbic Acid (CHEWABLE VITAMIN C PO) Take 100 mg by mouth.     B  Complex-Biotin-FA (B COMPLETE PO) Take by mouth.     CALCIUM PO Take 1,560 mg by mouth daily.     Cholecalciferol (D3-1000 PO) Take by mouth.     Misc Natural Products (JOINT SUPPORT COMPLEX PO) Take by mouth.     Nutritional Supplements (NUTRITIONAL SUPPLEMENT PO) Take by mouth. Stress Relief Complex-One tablet daily     Nutritional Supplements (SOY PROTEIN SHAKE) POWD Take by mouth.     Omega-3 Fatty Acids (FISH OIL) 1000 MG CAPS Take by mouth.     TURMERIC PO Take by mouth.     UNABLE TO FIND Med Name: CarotoMax and Eye Taking 1 capsule by mouth after breakfast daily     UNABLE TO FIND Herb-Lax Taking 1 tablet by mouth after breakfast daily     UNABLE TO FIND Med Name: Mind Work     VITAMIN E PO Take by mouth.     ZINC OXIDE PO Take by mouth.     neomycin-polymyxin-dexamethasone (MAXITROL) 0.1 % ophthalmic suspension neomycin-polymyxin-dexameth 3.5 mg/mL-10,000 unit/mL-0.1% eye drops  APPLY 1 DROP IN THE AFFECTED EYE FOUR TIMES DAILY X 10 DAYS     No facility-administered medications prior to visit.     Per HPI unless specifically indicated in ROS section below Review of Systems  Constitutional:  Positive for fever. Negative for chills and fatigue.  HENT:  Positive for postnasal drip and sneezing. Negative for congestion, ear discharge, ear pain, sinus pressure, sinus pain and sore throat.  Respiratory:  Positive for cough (non productive but wet). Negative for shortness of breath.   Cardiovascular:  Negative for chest pain.  Gastrointestinal:  Negative for abdominal pain, diarrhea, nausea and vomiting.  Musculoskeletal:  Negative for arthralgias and myalgias.  Neurological:  Positive for headaches.  Objective:  Temp 98.9 F (37.2 C) Comment: per patient  Wt Readings from Last 3 Encounters:  05/02/21 132 lb (59.9 kg)  12/08/20 133 lb (60.3 kg)  10/21/20 133 lb (60.3 kg)       Physical exam: Gen: alert, NAD, not ill appearing Pulm: speaks in complete sentences without  increased work of breathing Psych: normal mood, normal thought content      Results for orders placed or performed in visit on 12/08/20  Sedimentation rate  Result Value Ref Range   Sed Rate 2 0 - 40 mm/hr  C-reactive protein  Result Value Ref Range   CRP <1 0 - 10 mg/L   Assessment & Plan:   Problem List Items Addressed This Visit       Other   Acute cough - Primary    Over the past month per patient report.  States yesterday became more of a "rough cough".  She did test for COVID and advised her that we will do a send off COVID test that she did test kind of early for symptom onset.  She continues with over-the-counter regimens as needed  If COVID test is negative.  Can consider getting a chest x-ray since cough has been going on for so long albeit intermittent.  Also consider empiric antibiotic coverage of azithromycin.  Pending results      Relevant Orders   Novel Coronavirus, NAA ([facility])   Fever    Can use over-the-counter analgesics as needed.  Pending PCR COVID test      Relevant Orders   Novel Coronavirus, NAA ([facility])   Other fatigue    Pending send out COVID test.      Relevant Orders   Novel Coronavirus, NAA ([facility])     No orders of the defined types were placed in this encounter.  No orders of the defined types were placed in this encounter.   I discussed the assessment and treatment plan with the patient. The patient was provided an opportunity to ask questions and all were answered. The patient agreed with the plan and demonstrated an understanding of the instructions. The patient was advised to call back or seek an in-person evaluation if the symptoms worsen or if the condition fails to improve as anticipated.  Follow up plan: No follow-ups on file.  Romilda Garret, NP

## 2021-07-05 NOTE — Assessment & Plan Note (Addendum)
Over the past month per patient report.  States yesterday became more of a "rough cough".  She did test for COVID and advised her that we will do a send off COVID test that she did test kind of early for symptom onset.  She continues with over-the-counter regimens as needed  If COVID test is negative.  Can consider getting a chest x-ray since cough has been going on for so long albeit intermittent.  Also consider empiric antibiotic coverage of azithromycin.  Pending results

## 2021-07-06 ENCOUNTER — Telehealth: Payer: Self-pay | Admitting: Nurse Practitioner

## 2021-07-06 ENCOUNTER — Encounter: Payer: Self-pay | Admitting: Nurse Practitioner

## 2021-07-06 LAB — SARS-COV-2, NAA 2 DAY TAT

## 2021-07-06 LAB — NOVEL CORONAVIRUS, NAA: SARS-CoV-2, NAA: DETECTED — AB

## 2021-07-06 NOTE — Telephone Encounter (Signed)
Called and spoke with patient in regards to her positive covid test. States that she is feeling better. Did discuss antiviral treatments. She will send me a message tomorrow and let me know if she wants to purse. Also discussed CDC guidelines in regards to quarantine

## 2021-07-06 NOTE — Telephone Encounter (Signed)
Pt is wanting a call back concerning her being covid positive. She is returning a call from [place] [facility]. Please advise pt at [phone].

## 2021-07-07 ENCOUNTER — Other Ambulatory Visit: Payer: Self-pay | Admitting: Nurse Practitioner

## 2021-07-07 DIAGNOSIS — U071 COVID-19: Secondary | ICD-10-CM

## 2021-07-07 MED ORDER — MOLNUPIRAVIR EUA 200MG CAPSULE: 4.0000 | ORAL_CAPSULE | Freq: Two times a day (BID) | ORAL | 0 refills | Status: AC

## 2021-08-16 ENCOUNTER — Other Ambulatory Visit: Payer: Self-pay

## 2021-08-16 ENCOUNTER — Encounter: Payer: Self-pay | Admitting: Primary Care

## 2021-08-16 ENCOUNTER — Ambulatory Visit (INDEPENDENT_AMBULATORY_CARE_PROVIDER_SITE_OTHER): Payer: Medicare Other | Admitting: Primary Care

## 2021-08-16 VITALS — BP 128/80 | HR 82 | Ht 60.0 in | Wt 132.6 lb

## 2021-08-16 DIAGNOSIS — E785 Hyperlipidemia, unspecified: Secondary | ICD-10-CM

## 2021-08-16 DIAGNOSIS — R030 Elevated blood-pressure reading, without diagnosis of hypertension: Secondary | ICD-10-CM

## 2021-08-16 DIAGNOSIS — R002 Palpitations: Secondary | ICD-10-CM | POA: Diagnosis not present

## 2021-08-16 NOTE — Assessment & Plan Note (Signed)
Controlled in the office today, continue to monitor. ?

## 2021-08-16 NOTE — Assessment & Plan Note (Signed)
Not currently on treatment. ? ?Repeat lipid panel pending. ?

## 2021-08-16 NOTE — Progress Notes (Signed)
? ?Subjective:  ? ? Patient ID: Kelly James, female    DOB: [birth year], 66 y.o.   MRN: [id number] ? ?HPI ? ?Kelly James is a very pleasant 66 y.o. female with a history of GERD, genital herpes, hyperlipidemia who presents today to discuss palpitations.  ? ?1) History of Covid-19: She saw her eye doctor on 07/04/21. After her visit she began feeling "strange". She tested for Covid-19 infection with a home test which was negative. She was evaluated by [facility] NP on 07/05/21, completed PCR test which was positive. Symptoms included mild fever, mild nausea, fatigue which lasted for about 1 week. She's mostly doing well, does notice fatigue at times.  ? ?2) Palpitations: Chronic and intermittent palpitations for the last year, has noticed palpitations when waking in the morning when lying in bed. This will last a few minutes until she stands up. She does not experience palpations during the day or at night when laying down. This began about 1 year ago, intermittent, but over the last 3 weeks she's noticed increased frequency for which are now occurring daily. ? ?She's managed on numerous OTC supplements and vitamins, she takes some with dinner and some in the morning. She suspects her palpitations are secondary to the supplements she takes at night.  ? ?She denies nausea, chest pain, dizziness, headaches, anxiety with palpitations.  ? ?History of hyperlipidemia, not on therapy. Last lipid panel was in 2021. She does drink 2 cups of coffee every morning, rarely drinks 1 cup in the afternoon. No other caffeine during the day. She drinks water for most of her day. ? ?BP Readings from Last 3 Encounters:  ?08/16/21 128/80  ?05/02/21 126/62  ?12/08/20 (!) 143/88  ? ? ? ? ?Review of Systems  ?Constitutional:  Negative for fatigue.  ?Respiratory:  Negative for shortness of breath.   ?Cardiovascular:  Positive for palpitations. Negative for chest pain.  ?Neurological:  Negative for dizziness and headaches.   ?Psychiatric/Behavioral:  The patient is not nervous/anxious.   ? ?   ? ? ?Past Medical History:  ?Diagnosis Date  ? Atypical mole 09/10/2019  ? L ant thigh near the knee   ? Migraines   ? UTI (urinary tract infection)   ? ? ?Social History  ? ?Socioeconomic History  ? Marital status: Married  ?  Spouse name: Not on file  ? Number of children: Not on file  ? Years of education: Not on file  ? Highest education level: Not on file  ?Occupational History  ? Not on file  ?Tobacco Use  ? Smoking status: Never  ? Smokeless tobacco: Never  ?Substance and Sexual Activity  ? Alcohol use: No  ? Drug use: No  ? Sexual activity: Not on file  ?Other Topics Concern  ? Not on file  ?Social History Narrative  ? Married.  ? 2 children, 2 grandchildren.  ? 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  ? Enjoys baking.  ? ?Social Determinants of Health  ? ?Financial Resource Strain: Not on file  ?Food Insecurity: Not on file  ?Transportation Needs: Not on file  ?Physical Activity: Not on file  ?Stress: Not on file  ?Social Connections: Not on file  ?Intimate Partner Violence: Not on file  ? ? ?Past Surgical History:  ?Procedure Laterality Date  ? TUBAL LIGATION  1984  ? ? ?Family History  ?Problem Relation Age of Onset  ? Arthritis Mother   ? Alzheimer's disease Mother   ? Alcohol abuse Father   ?  Asthma Sister   ? Asthma Brother   ? ? ?Allergies  ?Allergen Reactions  ? Codeine   ?  Other reaction(s): Agitation  ? Sulfa Antibiotics Hives  ? ? ?Current Outpatient Medications on File Prior to Visit  ?Medication Sig Dispense Refill  ? acyclovir (ZOVIRAX) 400 MG tablet Take 400 mg by mouth 2 (two) times daily.    ? ALFALFA PO Take by mouth.    ? Ascorbic Acid (CHEWABLE VITAMIN C PO) Take 100 mg by mouth.    ? Ascorbic Acid (VITA-C PO)     ? B Complex-Biotin-FA (B COMPLETE PO) Take by mouth.    ? CALCIUM PO Take 1,560 mg by mouth daily.    ? Cholecalciferol (D3-1000 PO) Take by mouth.    ? Misc Natural Products (JOINT SUPPORT COMPLEX PO) Take by  mouth.    ? Nutritional Supplements (NUTRITIONAL SUPPLEMENT PO) Take by mouth. Stress Relief Complex-One tablet daily    ? Nutritional Supplements (SOY PROTEIN SHAKE) POWD Take by mouth.    ? Omega-3 Fatty Acids (FISH OIL) 1000 MG CAPS Take by mouth.    ? Propylene Glycol (SYSTANE BALANCE OP) Apply to eye.    ? TURMERIC PO Take by mouth.    ? UNABLE TO FIND Med Name: CarotoMax and Eye ?Taking 1 capsule by mouth after breakfast daily    ? UNABLE TO FIND Herb-Lax ?Taking 1 tablet by mouth after breakfast daily    ? UNABLE TO FIND Med Name: Mind Work    ? VITAMIN E PO Take by mouth.    ? ZINC OXIDE PO Take by mouth.    ? Zinc Sulfate (ZINC 15 PO) Take by mouth.    ? ?No current facility-administered medications on file prior to visit.  ? ? ?BP 128/80   Pulse 82   Ht 5' (1.524 m)   Wt 132 lb 9.6 oz (60.1 kg)   SpO2 98%   BMI 25.90 kg/m?  ?Objective:  ? Physical Exam ?Cardiovascular:  ?   Rate and Rhythm: Normal rate and regular rhythm.  ?Pulmonary:  ?   Effort: Pulmonary effort is normal.  ?   Breath sounds: Normal breath sounds.  ?Musculoskeletal:  ?   Cervical back: Neck supple.  ?Skin: ?   General: Skin is warm and dry.  ?Psychiatric:     ?   Mood and Affect: Mood normal.  ? ? ? ? ? ?   ?Assessment & Plan:  ? ? ? ? ?This visit occurred during the SARS-CoV-2 public health emergency.  Safety protocols were in place, including screening questions prior to the visit, additional usage of staff PPE, and extensive cleaning of exam room while observing appropriate contact time as indicated for disinfecting solutions.  ?

## 2021-08-16 NOTE — Assessment & Plan Note (Addendum)
Chronic, increased frequency recently. ? ?Exam today benign. ? ?ECG today with  NSR with rate of 81, no ST changes, PAC/PVCS.  ?No old EKG on file to compare. ? ?Checking labs today including CBC, lipids, TSH, CMP. ? ?She will stop taking her evening supplements.  She is on numerous supplements which could be interacting and or causing symptoms. ? ?Consider cardiology evaluation, discussed this with patient today. ?

## 2021-08-16 NOTE — Patient Instructions (Addendum)
Stop by the lab prior to leaving today. I will notify you of your results once received.  ? ?Stop taking your supplements at bedtime.  ? ?It was a pleasure to see you today! ? ? ?

## 2021-08-17 LAB — LIPID PANEL
Cholesterol: 221 mg/dL — ABNORMAL HIGH (ref 0–200)
HDL: 59.3 mg/dL (ref >39.00)
LDL Cholesterol: 131 mg/dL — ABNORMAL HIGH (ref 0–99)
NonHDL: 161.38
Total CHOL/HDL Ratio: 4
Triglycerides: 152 mg/dL — ABNORMAL HIGH (ref 0.0–149.0)
VLDL: 30.4 mg/dL (ref 0.0–40.0)

## 2021-08-17 LAB — CBC
HCT: 38.7 % (ref 36.0–46.0)
Hemoglobin: 13.4 g/dL (ref 12.0–15.0)
MCHC: 34.6 g/dL (ref 30.0–36.0)
MCV: 95.5 fl (ref 78.0–100.0)
Platelets: 262 10*3/uL (ref 150.0–400.0)
RBC: 4.05 Mil/uL (ref 3.87–5.11)
RDW: 13.1 % (ref 11.5–15.5)
WBC: 5.8 10*3/uL (ref 4.0–10.5)

## 2021-08-17 LAB — COMPREHENSIVE METABOLIC PANEL
ALT: 18 U/L (ref 0–35)
AST: 20 U/L (ref 0–37)
Albumin: 4.8 g/dL (ref 3.5–5.2)
Alkaline Phosphatase: 50 U/L (ref 39–117)
BUN: 17 mg/dL (ref 6–23)
CO2: 31 mEq/L (ref 19–32)
Calcium: 10.1 mg/dL (ref 8.4–10.5)
Chloride: 100 mEq/L (ref 96–112)
Creatinine, Ser: 0.86 mg/dL (ref 0.40–1.20)
GFR: 70.42 mL/min (ref >60.00)
Glucose, Bld: 98 mg/dL (ref 70–99)
Potassium: 3.7 mEq/L (ref 3.5–5.1)
Sodium: 139 mEq/L (ref 135–145)
Total Bilirubin: 0.6 mg/dL (ref 0.2–1.2)
Total Protein: 7.7 g/dL (ref 6.0–8.3)

## 2021-08-17 LAB — TSH: TSH: 3.24 u[IU]/mL (ref 0.35–5.50)

## 2021-08-18 NOTE — Telephone Encounter (Signed)
Do you want me to mail patient information about cholesterol diet?  ?

## 2021-10-11 DIAGNOSIS — Z1231 Encounter for screening mammogram for malignant neoplasm of breast: Secondary | ICD-10-CM | POA: Diagnosis not present

## 2021-10-11 LAB — HM DEXA SCAN: HM Dexa Scan: 1102023

## 2021-10-11 LAB — HM MAMMOGRAPHY

## 2021-10-12 DIAGNOSIS — Z01419 Encounter for gynecological examination (general) (routine) without abnormal findings: Secondary | ICD-10-CM | POA: Diagnosis not present

## 2021-10-12 DIAGNOSIS — Z6825 Body mass index (BMI) 25.0-25.9, adult: Secondary | ICD-10-CM | POA: Diagnosis not present

## 2021-10-12 DIAGNOSIS — M810 Age-related osteoporosis without current pathological fracture: Secondary | ICD-10-CM | POA: Diagnosis not present

## 2021-11-03 ENCOUNTER — Ambulatory Visit (INDEPENDENT_AMBULATORY_CARE_PROVIDER_SITE_OTHER): Payer: Medicare Other | Admitting: Primary Care

## 2021-11-03 ENCOUNTER — Encounter: Payer: Self-pay | Admitting: Primary Care

## 2021-11-03 DIAGNOSIS — M25512 Pain in left shoulder: Secondary | ICD-10-CM | POA: Diagnosis not present

## 2021-11-03 DIAGNOSIS — M25519 Pain in unspecified shoulder: Secondary | ICD-10-CM | POA: Insufficient documentation

## 2021-11-03 MED ORDER — KETOROLAC TROMETHAMINE 60 MG/2ML IM SOLN
60.0000 mg | Freq: Once | INTRAMUSCULAR | Status: AC
  Administered 2021-11-03: 60 mg via INTRAMUSCULAR

## 2021-11-03 NOTE — Addendum Note (Signed)
Addended by: Francella Solian on: 11/03/2021 01:59 PM   Modules accepted: Orders

## 2021-11-03 NOTE — Patient Instructions (Signed)
You may take Ibuprofen 400 mg every 8 hours as needed for pain.inflammation.  Please see Dr. Lorelei Pont, our sports medicine doctor, if your symptoms persist.   It was a pleasure to see you today!

## 2021-11-03 NOTE — Assessment & Plan Note (Signed)
Suspect bursitis given HPI and exam today.  Fortunately, she is improving.  IM Toradol 60 mg provided today. Discussed use of NSAID medications for any ongoing pain, start tomorrow.  Continue Tylenol PRN.  If no improvement then recommended she visit our Sports Medicine doctor for evaluation.

## 2021-11-03 NOTE — Progress Notes (Signed)
Subjective:    Patient ID: Kelly James, female    DOB: 09-01-1955, 66 y.o.   MRN: [id number]  HPI  Kelly James is a very pleasant 66 y.o. female with a history of GERD, genital herpes, vertigo, hyperlipidemia, frequent headaches, fatigue who presents today to discuss upper extremity pain.  Her pain is located to the left upper extremity that begins to the left anterior shoulder with radiation to left shoulder and upper extremity just proximal to the left elbow. She does notice left lateral neck pain. Symptom onset about 3 weeks ago.  She notices her pain when pick up her phone and putting it in her left pocket, dressing herself, driving, pulling or pushing objects. She denies numbness, injury/trauma. Overall she's noticed an improvement in her ROM.   She's taken Tylenol Arthritis inconsistently with temporary improvement. She has a history of same symptoms to right shoulder, received an injection of cortisone at the time with relief.   Review of Systems  Musculoskeletal:  Positive for arthralgias. Negative for back pain.  Neurological:  Negative for weakness and numbness.        Past Medical History:  Diagnosis Date   Atypical mole 09/10/2019   L ant thigh near the knee    Migraines    UTI (urinary tract infection)     Social History   Socioeconomic History   Marital status: Married    Spouse name: Not on file   Number of children: Not on file   Years of education: Not on file   Highest education level: Not on file  Occupational History   Not on file  Tobacco Use   Smoking status: Never   Smokeless tobacco: Never  Substance and Sexual Activity   Alcohol use: No   Drug use: No   Sexual activity: Not on file  Other Topics Concern   Not on file  Social History Narrative   Married.   2 children, 2 grandchildren.   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   Social Determinants of Health   Financial Resource Strain: Not on file  Food Insecurity:  Not on file  Transportation Needs: Not on file  Physical Activity: Not on file  Stress: Not on file  Social Connections: Not on file  Intimate Partner Violence: Not on file    Past Surgical History:  Procedure Laterality Date   TUBAL LIGATION  1984    Family History  Problem Relation Age of Onset   Arthritis Mother    Alzheimer's disease Mother    Alcohol abuse Father    Asthma Sister    Asthma Brother     Allergies  Allergen Reactions   Codeine     Other reaction(s): Agitation   Sulfa Antibiotics Hives    Current Outpatient Medications on File Prior to Visit  Medication Sig Dispense Refill   acyclovir (ZOVIRAX) 400 MG tablet Take 400 mg by mouth 2 (two) times daily.     ALFALFA PO Take by mouth.     Ascorbic Acid (CHEWABLE VITAMIN C PO) Take 100 mg by mouth.     Ascorbic Acid (VITA-C PO)      B Complex-Biotin-FA (B COMPLETE PO) Take by mouth.     CALCIUM PO Take 1,560 mg by mouth daily.     Cholecalciferol (D3-1000 PO) Take by mouth.     Misc Natural Products (JOINT SUPPORT COMPLEX PO) Take by mouth.     Nutritional Supplements (NUTRITIONAL SUPPLEMENT PO) Take by mouth.  Stress Relief Complex-One tablet daily     Nutritional Supplements (SOY PROTEIN SHAKE) POWD Take by mouth.     Omega-3 Fatty Acids (FISH OIL) 1000 MG CAPS Take by mouth.     Propylene Glycol (SYSTANE BALANCE OP) Apply to eye.     TURMERIC PO Take by mouth.     UNABLE TO FIND Med Name: CarotoMax and Eye Taking 1 capsule by mouth after breakfast daily     UNABLE TO FIND Herb-Lax Taking 1 tablet by mouth after breakfast daily     UNABLE TO FIND Med Name: Mind Work     VITAMIN E PO Take by mouth.     ZINC OXIDE PO Take by mouth.     Zinc Sulfate (ZINC 15 PO) Take by mouth.     No current facility-administered medications on file prior to visit.    BP 126/82   Pulse 97   Temp 97.7 F (36.5 C) (Oral)   Ht 5' (1.524 m)   Wt 131 lb (59.4 kg)   SpO2 99%   BMI 25.58 kg/m  Objective:   Physical  Exam Constitutional:      General: She is not in acute distress. Pulmonary:     Effort: Pulmonary effort is normal.  Musculoskeletal:     Left shoulder: No bony tenderness. Decreased range of motion. Normal strength.     Comments: Decrease in ROM with pain to lateral and posterior abduction.   Strength 5/5 to bilateral upper extremities.   Negative empty can test  Neurological:     Mental Status: She is alert.          Assessment & Plan:

## 2021-11-29 ENCOUNTER — Ambulatory Visit (INDEPENDENT_AMBULATORY_CARE_PROVIDER_SITE_OTHER): Payer: Medicare Other | Admitting: Family Medicine

## 2021-11-29 ENCOUNTER — Ambulatory Visit (INDEPENDENT_AMBULATORY_CARE_PROVIDER_SITE_OTHER)
Admission: RE | Admit: 2021-11-29 | Discharge: 2021-11-29 | Disposition: A | Discharge status: Home / Self Care | Payer: Medicare Other | Source: Ambulatory Visit | Attending: Family Medicine | Admitting: Family Medicine

## 2021-11-29 ENCOUNTER — Encounter: Payer: Self-pay | Admitting: Family Medicine

## 2021-11-29 VITALS — BP 128/80 | HR 82 | Temp 98.1°F | Ht 60.0 in | Wt 131.1 lb

## 2021-11-29 DIAGNOSIS — G8929 Other chronic pain: Secondary | ICD-10-CM

## 2021-11-29 DIAGNOSIS — M25512 Pain in left shoulder: Secondary | ICD-10-CM | POA: Diagnosis not present

## 2021-11-29 DIAGNOSIS — M7542 Impingement syndrome of left shoulder: Secondary | ICD-10-CM

## 2021-11-29 DIAGNOSIS — M19012 Primary osteoarthritis, left shoulder: Secondary | ICD-10-CM | POA: Diagnosis not present

## 2021-11-29 NOTE — Progress Notes (Signed)
Kelly Cubillos T. Kelly Deguzman, MD, [place] [facility] [place], [address]  Phone: [phone]  FAX: [phone]  Kelly James - 66 y.o. female  MRN [id number]  Date of Birth: Oct 23, 1955  Date: 11/29/2021  PCP: Pleas Koch, NP  Referral: Pleas Koch, NP  Chief Complaint  Patient presents with   Shoulder Pain    Left   Subjective:   Kelly James is a 66 y.o. very pleasant female patient with Body mass index is 25.61 kg/m. who presents with the following:  Patient presents as a new consultation for arm pain, and primary care doctor is Dr. Carlis Abbott.    She has pain in the deep shoulder joint on the left side.  She has no radicular pain, no numbness, no tingling and no neck pain.  She does not have an inciting event and she does not recall any kind of repetitive motion that could be preceding this.  She has pain in the plane of abduction and also notably in internal range of motion.  She does not sometimes have a deep dull ache and she has considerable amount of grinding with movement.  She also does some movements on her home gym that have been irritating the shoulder somewhat.  H/o old R shoulder injection, and this was done distantly.  She associates her shoulder pain with some oral calcium dosing and gynecology has adjusted this dose somewhat.    Review of Systems is noted in the HPI, as appropriate  Objective:   BP 128/80   Pulse 82   Temp 98.1 F (36.7 C) (Oral)   Ht 5' (1.524 m)   Wt 131 lb 2 oz (59.5 kg)   SpO2 99%   BMI 25.61 kg/m   GEN: No acute distress; alert,appropriate. PULM: Breathing comfortably in no respiratory distress PSYCH: Normally interactive.    Shoulder: L Inspection: No muscle wasting or winging Ecchymosis/edema: neg  AC joint, scapula, clavicle: Mildly tender to palpation Cervical spine: NT, full ROM Spurling's: neg Abduction: full, 5/5, she does have a painful  arc of motion Flexion: full, 5/5 IR, full, lift-off: 5/5, she does have some pain with terminal motion ER at neutral: full, 5/5 AC crossover and compression: neg Neer: Some pain with some sticking in the midpoint Hawkins: Modest Drop Test: neg Empty Can: neg Supraspinatus insertion: NT Bicipital groove: NT Speed's: neg Yergason's: neg Sulcus sign: neg Scapular dyskinesis: none C5-T1 intact Sensation intact Grip 5/5  Laboratory and Imaging Data:  Assessment and Plan:     ICD-10-CM   1. Impingement syndrome of left shoulder  M75.42     2. Chronic left shoulder pain  M25.512 DG Shoulder Left   G89.29      Total encounter time: 30 minutes. This includes total time spent on the day of encounter.   On the plain film, there is some minor inferior glenohumeral joint osteoarthritis, but overall joint spaces preserved.  There is some mild acromioclavicular arthritic change.  Think predominant issue here is some impingement, and some movements that she was doing with large range of motion of the shoulder while working out as aggravated her rotator cuff some.  I gave her some activity modifications and protocol from [facility].  If her symptoms persist and she is not doing well, then formal physical therapy would be an appropriate neck step.   Orders placed today for conditions managed today: Orders Placed This Encounter  Procedures   DG Shoulder Left  Follow-up if needed: No follow-ups on file.  [employer] One speech-to-text software was used for transcription in this dictation.  Possible transcriptional errors can occur using Editor, commissioning.   Signed,  Maud Deed. Haron Beilke, MD   Outpatient Encounter Medications as of 11/29/2021  Medication Sig   acyclovir (ZOVIRAX) 400 MG tablet Take 400 mg by mouth 2 (two) times daily.   ALFALFA PO Take by mouth.   Ascorbic Acid (CHEWABLE VITAMIN C PO) Take 100 mg by mouth.   Ascorbic Acid (VITA-C PO)    B Complex-Biotin-FA (B  COMPLETE PO) Take by mouth.   CALCIUM PO Take 1,560 mg by mouth daily.   Cholecalciferol (D3-1000 PO) Take by mouth.   Misc Natural Products (JOINT SUPPORT COMPLEX PO) Take by mouth.   Nutritional Supplements (NUTRITIONAL SUPPLEMENT PO) Take by mouth. Stress Relief Complex-One tablet daily   Nutritional Supplements (NUTRITIONAL SUPPLEMENT PO) Shaklee Collagen 9-2 scoops daily   Nutritional Supplements (NUTRITIONAL SUPPLEMENT PO) Take by mouth. [place]lee Cholesterol Reduction - 2 tablets by mouth twice a day   Nutritional Supplements (NUTRITIONAL SUPPLEMENT PO) Take by mouth. [place]lee Co Q Heart-1 capsule by mouth daily   Nutritional Supplements (SOY PROTEIN SHAKE) POWD Take by mouth.   Omega-3 Fatty Acids (FISH OIL) 1000 MG CAPS Take by mouth.   Propylene Glycol (SYSTANE BALANCE OP) Apply to eye.   RESVERATROL PO Take 1 Capful by mouth daily.   TURMERIC PO Take by mouth.   UNABLE TO FIND Med Name: CarotoMax and Eye Taking 1 capsule by mouth after breakfast daily   UNABLE TO FIND Herb-Lax Taking 1 tablet by mouth after breakfast daily   UNABLE TO FIND Med Name: Mind Work   VITAMIN E PO Take by mouth.   ZINC OXIDE PO Take by mouth.   Zinc Sulfate (ZINC 15 PO) Take by mouth.   No facility-administered encounter medications on file as of 11/29/2021.

## 2021-12-13 ENCOUNTER — Encounter: Payer: Self-pay | Admitting: Family Medicine

## 2021-12-21 ENCOUNTER — Ambulatory Visit (INDEPENDENT_AMBULATORY_CARE_PROVIDER_SITE_OTHER): Payer: Medicare Other

## 2021-12-21 VITALS — Wt 135.0 lb

## 2021-12-21 DIAGNOSIS — Z0000 Encounter for general adult medical examination without abnormal findings: Secondary | ICD-10-CM

## 2021-12-21 NOTE — Progress Notes (Signed)
I connected with Kelly James today by telephone and verified that I am speaking with the correct person using two identifiers. Location patient: home Location provider: work Persons participating in the virtual visit: Kelly James, Glenna Durand LPN.   I discussed the limitations, risks, security and privacy concerns of performing an evaluation and management service by telephone and the availability of in person appointments. I also discussed with the patient that there may be a patient responsible charge related to this service. The patient expressed understanding and verbally consented to this telephonic visit.    Interactive audio and video telecommunications were attempted between this provider and patient, however failed, due to patient having technical difficulties OR patient did not have access to video capability.  We continued and completed visit with audio only.     Vital signs may be patient reported or missing.  Subjective:   Kelly James is a 66 y.o. female who presents for an Initial Medicare Annual Wellness Visit.  Review of Systems     Cardiac Risk Factors include: advanced age (>7mn, >>29women);dyslipidemia     Objective:    Today's Vitals   12/21/21 1211 12/21/21 1213  Weight: 135 lb (61.2 kg)   PainSc:  0-No pain   Body mass index is 26.37 kg/m.     12/21/2021   12:21 PM  Advanced Directives  Does Patient Have a Medical Advance Directive? Yes  [place]  Copy of H[place] Chart? No - copy requested    Current Medications (verified) Outpatient Encounter Medications as of 12/21/2021  Medication Sig   ALFALFA PO Take by mouth.   Ascorbic Acid (CHEWABLE VITAMIN C PO) Take 100 mg by mouth.   Ascorbic Acid (VITA-C PO)    B Complex-Biotin-FA (B COMPLETE PO) Take by mouth.   CALCIUM PO Take 1,560 mg by mouth daily.   Cholecalciferol (D3-1000 PO) Take by mouth.   Misc Natural  Products (JOINT SUPPORT COMPLEX PO) Take by mouth.   Nutritional Supplements (NUTRITIONAL SUPPLEMENT PO) Take by mouth. Stress Relief Complex-One tablet daily   Nutritional Supplements (NUTRITIONAL SUPPLEMENT PO) [place] Collagen 9-2 scoops daily   Nutritional Supplements (NUTRITIONAL SUPPLEMENT PO) Take by mouth. [place] Cholesterol Reduction - 2 tablets by mouth twice a day   Nutritional Supplements (NUTRITIONAL SUPPLEMENT PO) Take by mouth. [place]lee Co Q Heart-1 capsule by mouth daily   Nutritional Supplements (SOY PROTEIN SHAKE) POWD Take by mouth.   Omega-3 Fatty Acids (FISH OIL) 1000 MG CAPS Take by mouth.   Propylene Glycol (SYSTANE BALANCE OP) Apply to eye.   RESVERATROL PO Take 1 Capful by mouth daily.   TURMERIC PO Take by mouth.   UNABLE TO FIND Med Name: CarotoMax and Eye Taking 1 capsule by mouth after breakfast daily   UNABLE TO FIND Herb-Lax Taking 1 tablet by mouth after breakfast daily   UNABLE TO FIND Med Name: Mind Work   VITAMIN E PO Take by mouth.   ZINC OXIDE PO Take by mouth.   Zinc Sulfate (ZINC 15 PO) Take by mouth.   acyclovir (ZOVIRAX) 400 MG tablet Take 400 mg by mouth 2 (two) times daily.   No facility-administered encounter medications on file as of 12/21/2021.    Allergies (verified) Codeine and Sulfa antibiotics   History: Past Medical History:  Diagnosis Date   Atypical mole 09/10/2019   L ant thigh near the knee    Migraines    UTI (urinary tract infection)  Past Surgical History:  Procedure Laterality Date   TUBAL LIGATION  1984   Family History  Problem Relation Age of Onset   Arthritis Mother    Alzheimer's disease Mother    Alcohol abuse Father    Asthma Sister    Asthma Brother    Social History   Socioeconomic History   Marital status: Married    Spouse name: Not on file   Number of children: Not on file   Years of education: Not on file   Highest education level: Not on file  Occupational History   Not on file  Tobacco  Use   Smoking status: Never   Smokeless tobacco: Never  Vaping Use   Vaping Use: Never used  Substance and Sexual Activity   Alcohol use: No   Drug use: No   Sexual activity: Not on file  Other Topics Concern   Not on file  Social History Narrative   Married.   2 children, 2 grandchildren.   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   Social Determinants of Health   Financial Resource Strain: Low Risk  (12/21/2021)   Overall Financial Resource Strain (CARDIA)    Difficulty of Paying Living Expenses: Not hard at all  Food Insecurity: No Food Insecurity (12/21/2021)   Hunger Vital Sign    Worried About Running Out of Food in the Last Year: Never true    Ran Out of Food in the Last Year: Never true  Transportation Needs: No Transportation Needs (12/21/2021)   PRAPARE - Hydrologist (Medical): No    Lack of Transportation (Non-Medical): No  Physical Activity: Insufficiently Active (12/21/2021)   Exercise Vital Sign    Days of Exercise per Week: 4 days    Minutes of Exercise per Session: 30 min  Stress: No Stress Concern Present (12/21/2021)   [place]    Feeling of Stress : Not at all  Social Connections: Not on file    Tobacco Counseling Counseling given: Not Answered   Clinical Intake:  Pre-visit preparation completed: Yes  Pain : No/denies pain Pain Score: 0-No pain     Nutritional Status: BMI 25 -29 Overweight Nutritional Risks: None Diabetes: No  How often do you need to have someone help you when you read instructions, pamphlets, or other written materials from your doctor or pharmacy?: 1 - Never What is the last grade level you completed in school?: 12th grade  Diabetic? no  Interpreter Needed?: No  Information entered by :: NAllen LPN   Activities of Daily Living    12/21/2021   12:23 PM 05/02/2021   10:08 AM  In your present state of  health, do you have any difficulty performing the following activities:  Hearing? 0 0  Vision? 0 0  Difficulty concentrating or making decisions? 0 0  Walking or climbing stairs? 0 0  Dressing or bathing? 0 0  Doing errands, shopping? 0 0  Preparing Food and eating ? N   Using the Toilet? N   In the past six months, have you accidently leaked urine? Y   Do you have problems with loss of bowel control? N   Managing your Medications? N   Managing your Finances? N   Housekeeping or managing your Housekeeping? N     Patient Care Team: Pleas Koch, NP as PCP - General (Internal Medicine)  Indicate any recent Medical Services you may  have received from other than [facility] providers in the past year (date may be approximate).     Assessment:   This is a routine wellness examination for Kelly James.  Hearing/Vision screen Vision Screening - Comments:: Regular eye exams, Dr. Macarthur Critchley  Dietary issues and exercise activities discussed: Current Exercise Habits: Home exercise routine, Type of exercise: strength training/weights;stretching, Time (Minutes): 30, Frequency (Times/Week): 4, Weekly Exercise (Minutes/Week): 120   Goals Addressed             This Visit's Progress    Patient Stated       12/21/2021, wants shoulder to stop hurting       Depression Screen    12/21/2021   12:22 PM 08/16/2021    2:21 PM 05/02/2021   10:07 AM 03/03/2020   11:38 AM  PHQ 2/9 Scores  PHQ - 2 Score 0 0 0 0  PHQ- 9 Score   0 0    Fall Risk    12/21/2021   12:22 PM 08/16/2021    2:21 PM  [place] in the past year? 0 0  Number falls in past yr: 0 0  Injury with Fall? 0 0  Risk for fall due to : No Fall Risks   Follow up Falls evaluation completed;Education provided;Falls prevention discussed Falls evaluation completed    FALL RISK PREVENTION PERTAINING TO THE HOME:  Any stairs in or around the home? Yes  If so, are there any without handrails? No  Home free of loose throw  rugs in walkways, pet beds, electrical cords, etc? Yes  Adequate lighting in your home to reduce risk of falls? Yes   ASSISTIVE DEVICES UTILIZED TO PREVENT FALLS:  Life alert? No  Use of a cane, walker or w/c? No  Grab bars in the bathroom? No  Shower chair or bench in shower? Yes  Elevated toilet seat or a handicapped toilet? No   TIMED UP AND GO:  Was the test performed? No .       Cognitive Function:        12/21/2021   12:25 PM  6CIT Screen  What Year? 0 points  What month? 0 points  What time? 0 points  Count back from 20 0 points  Months in reverse 0 points  Repeat phrase 0 points  Total Score 0 points    Immunizations Immunization History  Administered Date(s) Administered   Influenza,inj,Quad PF,6+ Mos 07/17/2019   Influenza-Unspecified 03/22/2021   PFIZER(Purple Top)SARS-COV-2 Vaccination 10/15/2019, 11/05/2019, 06/09/2020, 03/22/2021   Tdap 07/17/2019   Zoster Recombinat (Shingrix) 08/04/2019, 12/30/2019    TDAP status: Up to date  Flu Vaccine status: Up to date  Pneumococcal vaccine status: Due, Education has been provided regarding the importance of this vaccine. Advised may receive this vaccine at local pharmacy or Health Dept. Aware to provide a copy of the vaccination record if obtained from local pharmacy or Health Dept. Verbalized acceptance and understanding.  Covid-19 vaccine status: Completed vaccines  Qualifies for Shingles Vaccine? Yes   Zostavax completed Yes   Shingrix Completed?: Yes  Screening Tests Health Maintenance  Topic Date Due   Pneumonia Vaccine 70+ Years old (1 - PCV) Never done   COVID-19 Vaccine (5 - Booster for [place] series) 05/17/2021   MAMMOGRAM  05/21/2021   INFLUENZA VACCINE  01/02/2022   DEXA SCAN  05/20/2022   COLONOSCOPY (Pts 45-81yr Insurance coverage will need to be confirmed)  07/08/2028   TETANUS/TDAP  07/16/2029   Hepatitis C Screening  Completed   Zoster Vaccines- Shingrix  Completed   HPV VACCINES   Aged Out    Health Maintenance  Health Maintenance Due  Topic Date Due   Pneumonia Vaccine 1+ Years old (1 - PCV) Never done   COVID-19 Vaccine (5 - Booster for Pfizer series) 05/17/2021   MAMMOGRAM  05/21/2021    Colorectal cancer screening: Type of screening: Colonoscopy. Completed 07/08/2018. Repeat every 10 years  Mammogram status: Completed 10/11/2021. Repeat every year  Bone Density status: Completed 05/21/2019.  Lung Cancer Screening: (Low Dose CT Chest recommended if Age 4-80 years, 30 pack-year currently smoking OR have quit w/in 15years.) does not qualify.   Lung Cancer Screening Referral: no  Additional Screening:  Hepatitis C Screening: does qualify; Completed 07/13/2019  Vision Screening: Recommended annual ophthalmology exams for early detection of glaucoma and other disorders of the eye. Is the patient up to date with their annual eye exam?  Yes  Who is the provider or what is the name of the office in which the patient attends annual eye exams? Dr. Nicki Reaper If pt is not established with a provider, would they like to be referred to a provider to establish care? No .   Dental Screening: Recommended annual dental exams for proper oral hygiene  Community Resource Referral / Chronic Care Management: CRR required this visit?  No   CCM required this visit?  No      Plan:     I have personally reviewed and noted the following in the patient's chart:   Medical and social history Use of alcohol, tobacco or illicit drugs  Current medications and supplements including opioid prescriptions. Patient is not currently taking opioid prescriptions. Functional ability and status Nutritional status Physical activity Advanced directives List of other physicians Hospitalizations, surgeries, and ER visits in previous 12 months Vitals Screenings to include cognitive, depression, and falls Referrals and appointments  In addition, I have reviewed and discussed with patient  certain preventive protocols, quality metrics, and best practice recommendations. A written personalized care plan for preventive services as well as general preventive health recommendations were provided to patient.     Kellie Simmering, LPN   [id number]   Nurse Notes: none  Due to this being a virtual visit, the after visit summary with patients personalized plan was offered to patient via mail or my-chart. Patient would like to access on my-chart

## 2021-12-21 NOTE — Patient Instructions (Signed)
Kelly James , Thank you for taking time to come for your Medicare Wellness Visit. I appreciate your ongoing commitment to your health goals. Please review the following plan we discussed and let me know if I can assist you in the future.   Screening recommendations/referrals: Colonoscopy: completed 07/08/2018, due 07/08/2028 Mammogram: completed 10/11/2021, due 10/13/2022 Bone Density: completed 05/21/2019 Recommended yearly ophthalmology/optometry visit for glaucoma screening and checkup Recommended yearly dental visit for hygiene and checkup  Vaccinations: Influenza vaccine: due 01/02/2022 Pneumococcal vaccine: due Tdap vaccine: completed 07/17/2019, due 07/16/2029 Shingles vaccine: completed   Covid-19: 03/22/2021, 06/09/2020, 11/05/2019, 10/15/2019  Advanced directives: Please bring a copy of your POA (Power of Attorney) and/or Living Will to your next appointment.   Conditions/risks identified: none  Next appointment: Follow up in one year for your annual wellness visit    Preventive Care 65 Years and Older, Female Preventive care refers to lifestyle choices and visits with your health care provider that can promote health and wellness. What does preventive care include? A yearly physical exam. This is also called an annual well check. Dental exams once or twice a year. Routine eye exams. Ask your health care provider how often you should have your eyes checked. Personal lifestyle choices, including: Daily care of your teeth and gums. Regular physical activity. Eating a healthy diet. Avoiding tobacco and drug use. Limiting alcohol use. Practicing safe sex. Taking low-dose aspirin every day. Taking vitamin and mineral supplements as recommended by your health care provider. What happens during an annual well check? The services and screenings done by your health care provider during your annual well check will depend on your age, overall health, lifestyle risk factors, and family history of  disease. Counseling  Your health care provider may ask you questions about your: Alcohol use. Tobacco use. Drug use. Emotional well-being. Home and relationship well-being. Sexual activity. Eating habits. History of falls. Memory and ability to understand (cognition). Work and work Statistician. Reproductive health. Screening  You may have the following tests or measurements: Height, weight, and BMI. Blood pressure. Lipid and cholesterol levels. These may be checked every 5 years, or more frequently if you are over 50 years old. Skin check. Lung cancer screening. You may have this screening every year starting at age 52 if you have a 30-pack-year history of smoking and currently smoke or have quit within the past 15 years. Fecal occult blood test (FOBT) of the stool. You may have this test every year starting at age 6. Flexible sigmoidoscopy or colonoscopy. You may have a sigmoidoscopy every 5 years or a colonoscopy every 10 years starting at age 51. Hepatitis C blood test. Hepatitis B blood test. Sexually transmitted disease (STD) testing. Diabetes screening. This is done by checking your blood sugar (glucose) after you have not eaten for a while (fasting). You may have this done every 1-3 years. Bone density scan. This is done to screen for osteoporosis. You may have this done starting at age 36. Mammogram. This may be done every 1-2 years. Talk to your health care provider about how often you should have regular mammograms. Talk with your health care provider about your test results, treatment options, and if necessary, the need for more tests. Vaccines  Your health care provider may recommend certain vaccines, such as: Influenza vaccine. This is recommended every year. Tetanus, diphtheria, and acellular pertussis (Tdap, Td) vaccine. You may need a Td booster every 10 years. Zoster vaccine. You may need this after age 27. Pneumococcal 13-valent conjugate (  PCV13) vaccine. One  dose is recommended after age 4. Pneumococcal polysaccharide (PPSV23) vaccine. One dose is recommended after age 90. Talk to your health care provider about which screenings and vaccines you need and how often you need them. This information is not intended to replace advice given to you by your health care provider. Make sure you discuss any questions you have with your health care provider. Document Released: 06/17/2015 Document Revised: 02/08/2016 Document Reviewed: 03/22/2015 Elsevier Interactive Patient Education  2017 [place] Prevention in the Home Falls can cause injuries. They can happen to people of all ages. There are many things you can do to make your home safe and to help prevent falls. What can I do on the outside of my home? Regularly fix the edges of walkways and driveways and fix any cracks. Remove anything that might make you trip as you walk through a door, such as a raised step or threshold. Trim any bushes or trees on the path to your home. Use bright outdoor lighting. Clear any walking paths of anything that might make someone trip, such as rocks or tools. Regularly check to see if handrails are loose or broken. Make sure that both sides of any steps have handrails. Any raised decks and porches should have guardrails on the edges. Have any leaves, snow, or ice cleared regularly. Use sand or salt on walking paths during winter. Clean up any spills in your garage right away. This includes oil or grease spills. What can I do in the bathroom? Use night lights. Install grab bars by the toilet and in the tub and shower. Do not use towel bars as grab bars. Use non-skid mats or decals in the tub or shower. If you need to sit down in the shower, use a plastic, non-slip stool. Keep the floor dry. Clean up any water that spills on the floor as soon as it happens. Remove soap buildup in the tub or shower regularly. Attach bath mats securely with double-sided  non-slip rug tape. Do not have throw rugs and other things on the floor that can make you trip. What can I do in the bedroom? Use night lights. Make sure that you have a light by your bed that is easy to reach. Do not use any sheets or blankets that are too big for your bed. They should not hang down onto the floor. Have a firm chair that has side arms. You can use this for support while you get dressed. Do not have throw rugs and other things on the floor that can make you trip. What can I do in the kitchen? Clean up any spills right away. Avoid walking on wet floors. Keep items that you use a lot in easy-to-reach places. If you need to reach something above you, use a strong step stool that has a grab bar. Keep electrical cords out of the way. Do not use floor polish or wax that makes floors slippery. If you must use wax, use non-skid floor wax. Do not have throw rugs and other things on the floor that can make you trip. What can I do with my stairs? Do not leave any items on the stairs. Make sure that there are handrails on both sides of the stairs and use them. Fix handrails that are broken or loose. Make sure that handrails are as long as the stairways. Check any carpeting to make sure that it is firmly attached to the stairs. Fix any carpet that is  loose or worn. Avoid having throw rugs at the top or bottom of the stairs. If you do have throw rugs, attach them to the floor with carpet tape. Make sure that you have a light switch at the top of the stairs and the bottom of the stairs. If you do not have them, ask someone to add them for you. What else can I do to help prevent falls? Wear shoes that: Do not have high heels. Have rubber bottoms. Are comfortable and fit you well. Are closed at the toe. Do not wear sandals. If you use a stepladder: Make sure that it is fully opened. Do not climb a closed stepladder. Make sure that both sides of the stepladder are locked into place. Ask  someone to hold it for you, if possible. Clearly mark and make sure that you can see: Any grab bars or handrails. First and last steps. Where the edge of each step is. Use tools that help you move around (mobility aids) if they are needed. These include: Canes. Walkers. Scooters. Crutches. Turn on the lights when you go into a dark area. Replace any light bulbs as soon as they burn out. Set up your furniture so you have a clear path. Avoid moving your furniture around. If any of your floors are uneven, fix them. If there are any pets around you, be aware of where they are. Review your medicines with your doctor. Some medicines can make you feel dizzy. This can increase your chance of falling. Ask your doctor what other things that you can do to help prevent falls. This information is not intended to replace advice given to you by your health care provider. Make sure you discuss any questions you have with your health care provider. Document Released: 03/17/2009 Document Revised: 10/27/2015 Document Reviewed: 06/25/2014 Elsevier Interactive Patient Education  2017 [employer].

## 2021-12-28 ENCOUNTER — Encounter: Payer: Self-pay | Admitting: Family Medicine

## 2021-12-28 DIAGNOSIS — M25511 Pain in right shoulder: Secondary | ICD-10-CM

## 2022-01-02 ENCOUNTER — Encounter: Payer: Self-pay | Admitting: Primary Care

## 2022-01-03 DIAGNOSIS — Z23 Encounter for immunization: Secondary | ICD-10-CM | POA: Diagnosis not present

## 2022-01-03 NOTE — Telephone Encounter (Signed)
ICD-10-CM   1. Acute pain of both shoulders  M25.511 Ambulatory referral to Physical Therapy   M25.512      Orders Placed This Encounter  Procedures   Ambulatory referral to Physical Therapy

## 2022-01-29 ENCOUNTER — Ambulatory Visit: Payer: Medicare Other | Attending: Family Medicine

## 2022-01-29 DIAGNOSIS — M25511 Pain in right shoulder: Secondary | ICD-10-CM | POA: Insufficient documentation

## 2022-01-29 DIAGNOSIS — M25512 Pain in left shoulder: Secondary | ICD-10-CM | POA: Diagnosis not present

## 2022-01-29 NOTE — Therapy (Signed)
[facility] [address] [place], [address] Phone: [phone]   Fax:  [phone]  Physical Therapy Evaluation  Patient Details  Name: Zakeria Kulzer MRN: [id number] Date of Birth: 09/11/1955 Referring Provider (PT): Owens Loffler, MD   Encounter Date: 01/29/2022   PT End of Session - 01/29/22 1416     Visit Number 1    Number of Visits 17    Date for PT Re-Evaluation 03/29/22    Authorization Type 1    Authorization Time Period of 10 progress report    PT Start Time 1417    PT Stop Time 1505    PT Time Calculation (min) 48 min    Equipment Utilized During Treatment --    Activity Tolerance Patient tolerated treatment well    Behavior During Therapy [facility] for tasks assessed/performed             Past Medical History:  Diagnosis Date   Atypical mole 09/10/2019   L ant thigh near the knee    Migraines    UTI (urinary tract infection)     Past Surgical History:  Procedure Laterality Date   TUBAL LIGATION  1984    There were no vitals filed for this visit.    Subjective Assessment - 01/29/22 1424     Subjective L shoulder joint 0/10 currently (2/10 wiht shoulder abduction), 8/10 at worst for the past 3 months. R shoulder: 0/10 currently, 3/10 at worst for the past 3 months    Pertinent History Acute B shoulder pain. L shoulder bothers her more. Woke up one morning several months ago B UE. R UE only at forearm. L UE included L lateral neck, L lateral shoulder and arm and forearm (around radial nerve). Pt also states having L upper trap swelling. Pt works out as often as she can. Has an old [employer] and does basic workouts there such as squats, supine B shoulder extension. 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    Patient Stated Goals Decrease pain.    Currently in Pain? Yes    Pain Score 2     Pain Location Shoulder     Pain Orientation Left;Right    Pain Type Acute pain    Pain Onset More than a month ago    Pain Frequency Occasional    Aggravating Factors  L shoulder joint: L shoulder abduction, doffing shirt. L shoulder IR, reaching behind her back.    Pain Relieving Factors resting position, sitting and doing nothing for a few hours. Ibuprofen                [facility] PT Assessment - 01/29/22 1421       Assessment   Medical Diagnosis M25.511,M25.512 (ICD-10-CM) - Acute pain of both shoulders    Referring Provider (PT) Owens Loffler, MD    Onset Date/Surgical Date 01/03/22      Precautions   Precaution Comments No known precautions      Restrictions   Other Position/Activity Restrictions No known restrictions      Balance Screen   Has the patient fallen in the past 6 months No    Has the patient had a decrease in activity level because of a fear of falling?  No    Is the patient reluctant to leave their home because of a fear of falling?  No  Observation/Other Assessments   Focus on Therapeutic Outcomes (FOTO)  Shoulder FOTO 51      Posture/Postural Control   Posture Comments Forward neck, R cervical lateral shift, L scapular protraction      AROM   Overall AROM Comments inconsistenc L shoulder symptms with AROM    Right Shoulder Flexion 148 Degrees   172 AAROM   Right Shoulder ABduction 172 Degrees   180 AAROM   Right Shoulder Internal Rotation --   Functional IR: normal   Right Shoulder External Rotation --   Functional ER: R middle finger to L superior scapula   Left Shoulder Flexion 145 Degrees   163 AAROM. with discomfort in shoulder initially   Left Shoulder ABduction 154 Degrees   180 degrees AAROM   Left Shoulder Internal Rotation --   Functional IR: L thumb to T7 spinous process with L shoulder joint, upper trap and upper lateral arm pain.   Left Shoulder External Rotation --   Functional ER: L middle finger to R superior scapula.   Cervical Flexion full     Cervical Extension WFL with L lateral neck discomfort.    Cervical - Right Side [facility] with L lateral neck pain    Cervical - Left Side [facility] with R lateral neck pulling    Cervical - Right Rotation WFL    Cervical - Left Rotation [facility]      Strength   Overall Strength Comments Manually resisted scapular retraction targeting lower trap: 4-/5 R and L    Right Shoulder Flexion 4/5    Right Shoulder ABduction 4/5    Right Shoulder Internal Rotation 4/5    Right Shoulder External Rotation 4-/5    Left Shoulder Flexion 4+/5   with shoulder pain   Left Shoulder ABduction 4/5   with shoulder pain   Left Shoulder Internal Rotation 4/5   with distal pectoralis (possibly tendon) pain.   Left Shoulder External Rotation 4/5    Right Elbow Flexion 4/5    Right Elbow Extension 4+/5    Left Elbow Flexion 4/5    Left Elbow Extension 4+/5    Right Wrist Extension 4+/5    Left Wrist Extension 4/5      Palpation   Palpation comment L upper trap muscle tension      Special Tests   Other special tests (-) Spulrlings for L UE; (-) empty can. (+) Michel Bickers L shoulder with slight reproduction of symptoms. (+) Yocum test.                        Objective measurements completed on examination: See above findings.   No latex allergies              Response to treatment Pt tolerated session well without aggravation of symptoms.     Clinical impression Pt is a 66 year old female who came to physical therapy secondary to B shoulder pain, L greater than the R. She also presents with altered posture, reproduction of symptoms with impingement tests for L shoulder, pain with L shoulder abduction as well as IR and ER, B scapular and shoulder weakness, and difficulty performing tasks which involve reaching to the side as well as behind her and donning and doffing shirts secondary to pain. Pt will benefit from skilled physical therapy services to address the aforementioned  deficits.              PT Education - 01/29/22 1607  Education Details Plan of care    Person(s) Educated Patient    Methods Explanation    Comprehension Verbalized understanding              PT Short Term Goals - 01/29/22 1559       PT SHORT TERM GOAL #1   Title Pt will be independent with her initial HEP to decrease pain, improve strength, function, and ability to reach more comfortably.    Baseline Pt has not yet started her HEP (01/29/2022)    Time 3    Period Weeks    Status New    Target Date 02/22/22               PT Long Term Goals - 01/29/22 1600       PT LONG TERM GOAL #1   Title Pt will have a decrease in L shoulder pain to 2/10 or less at worst to promote ability to reach, don and doff shirts more comfortably.    Baseline 8/10 L shoulder pain at worst for the past 3 months (01/29/2022)    Time 8    Period Weeks    Status New    Target Date 03/29/22      PT LONG TERM GOAL #2   Title Pt will have a decrease in R shoulder pain to 1/10 or less at worst to promote ability to reach as well as don and doff shirts more comfortably.    Baseline 3/10 R shoulder pain at worst for the past 3 months (01/29/2022)    Time 8    Period Weeks    Status New    Target Date 03/29/22      PT LONG TERM GOAL #3   Title Pt will improve her shoulder FOTO score by at least 10 points as a demonstration of improved function.    Baseline Shoulder FOTO 51 (01/29/2022)    Time 8    Period Weeks    Status New    Target Date 03/29/22      PT LONG TERM GOAL #4   Title Pt will improve B lower trap and ER muscle strength by at least 1/2 MMT grade to promote ability to reach more comfortably.    Baseline seated manually resisted scapular retraction targeting lower trap muscle: 4-/5 R and L, ER 4-/5 R, 4/5 L (01/29/2022)    Time 8    Period Weeks    Status New    Target Date 03/29/22                    Plan - 01/29/22 1555     Clinical Impression  Statement Pt is a 66 year old female who came to physical therapy secondary to B shoulder pain, L greater than the R. She also presents with altered posture, reproduction of symptoms with impingement tests for L shoulder, pain with L shoulder abduction as well as IR and ER, B scapular and shoulder weakness, and difficulty performing tasks which involve reaching to the side as well as behind her and donning and doffing shirts secondary to pain. Pt will benefit from skilled physical therapy services to address the aforementioned deficits.    Personal Factors and Comorbidities Age;Time since onset of injury/illness/exacerbation    Examination-Activity Limitations Reach Overhead;Dressing;Lift;Carry;Hygiene/Grooming    Stability/Clinical Decision Making Stable/Uncomplicated    Clinical Decision Making Low    Rehab Potential Fair    PT Frequency 2x / week    PT Duration 8 weeks  PT Treatment/Interventions Manual techniques;Therapeutic exercise;Therapeutic activities;Electrical Stimulation;Iontophoresis '4mg'$ /ml Dexamethasone;Neuromuscular re-education;Patient/family education;Dry needling;Spinal Manipulations;Joint Manipulations   manipulation if appropriate   PT Next Visit Plan scapular and rotator cuff strengthening, glenohumeral mechanics, stability. Isometric, concentric, eccentric loading of IR muscles comfortably. Manual techniques, modalities PRN.    Consulted and Agree with Plan of Care Patient             Patient will benefit from skilled therapeutic intervention in order to improve the following deficits and impairments:  Pain, Postural dysfunction, Impaired UE functional use, Improper body mechanics, Decreased strength  Visit Diagnosis: Acute pain of left shoulder  Acute pain of right shoulder     Problem List Patient Active Problem List   Diagnosis Date Noted   Acute shoulder pain 11/03/2021   Palpitations 08/16/2021   Other fatigue 07/05/2021   Rash and nonspecific skin  eruption 05/02/2021   Elevated blood pressure reading in office without diagnosis of hypertension 05/02/2021   Frequent headaches 10/21/2020   Joint stiffness 03/03/2020   Genital herpes 03/03/2020   Preventative health care 07/17/2019   Hyperlipidemia 07/17/2019   GERD (gastroesophageal reflux disease) 07/17/2019   Vertigo 08/27/2018   Knee pain 05/03/2017   Joneen Boers PT, DPT  01/29/2022, 4:10 PM  [place] [facility] [address] [place], [address] Phone: [phone]   Fax:  [phone]  Name: Statia Burdick MRN: [id number] Date of Birth: 1956/02/16

## 2022-02-12 ENCOUNTER — Ambulatory Visit: Payer: Medicare Other | Attending: Family Medicine

## 2022-02-12 DIAGNOSIS — M25512 Pain in left shoulder: Secondary | ICD-10-CM | POA: Insufficient documentation

## 2022-02-12 DIAGNOSIS — M25511 Pain in right shoulder: Secondary | ICD-10-CM | POA: Insufficient documentation

## 2022-02-12 NOTE — Therapy (Signed)
OUTPATIENT PHYSICAL THERAPY TREATMENT NOTE   Patient Name: Kelly James MRN: [id number] DOB:1955/09/30, 66 y.o., female Today's Date: 02/12/2022  PCP: Pleas Koch, NP REFERRING PROVIDER: Owens Loffler, MD   PT End of Session - 02/12/22 1018     Visit Number 2    Number of Visits 17    Date for PT Re-Evaluation 03/29/22    Authorization Type 2    Authorization Time Period of 10 progress report    PT Start Time 1018    PT Stop Time 1101    PT Time Calculation (min) 43 min    Activity Tolerance Patient tolerated treatment well    Behavior During Therapy [facility] for tasks assessed/performed             Past Medical History:  Diagnosis Date   Atypical mole 09/10/2019   L ant thigh near the knee    Migraines    UTI (urinary tract infection)    Past Surgical History:  Procedure Laterality Date   TUBAL LIGATION  1984   Patient Active Problem List   Diagnosis Date Noted   Acute shoulder pain 11/03/2021   Palpitations 08/16/2021   Other fatigue 07/05/2021   Rash and nonspecific skin eruption 05/02/2021   Elevated blood pressure reading in office without diagnosis of hypertension 05/02/2021   Frequent headaches 10/21/2020   Joint stiffness 03/03/2020   Genital herpes 03/03/2020   Preventative health care 07/17/2019   Hyperlipidemia 07/17/2019   GERD (gastroesophageal reflux disease) 07/17/2019   Vertigo 08/27/2018   Knee pain 05/03/2017    REFERRING DIAG: M25.511,M25.512 (ICD-10-CM) - Acute pain of both shoulders  THERAPY DIAG:  Acute pain of left shoulder  Acute pain of right shoulder  Rationale for Evaluation and Treatment Rehabilitation  PERTINENT HISTORY: Acute B shoulder pain. L shoulder bothers her more. Woke up one morning several months ago B UE. R UE only at forearm. L UE included L lateral neck, L lateral shoulder and arm and forearm (around radial nerve). Pt also states having L upper trap swelling. Pt works out as often as she can. Has an old  [employer] and does basic workouts there such as squats, supine B shoulder extension. 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  PRECAUTIONS: No known precautions  SUBJECTIVE: L shoulder is actually better so to speak. Has not been taking the ibuprofen on a regular basis. Had pain L forearm. R shoulder is not really having any pain in it right now.   PAIN:  Are you having pain? No pain currently.      TODAY'S TREATMENT:    Therapeutic exercise  Seated L triceps extension isometrics 10x5 seconds for 2 sets  L radial nerve symptoms, eaes with rest.   Standing B shoulder ER yellow band 10x  L radial nerve symptoms, eases with rest.   Standing L shoulder IR red band 7x  L distal pect minor discomfort (coracoid process), eases with rest   Standing B scapular retraction red band 10x, then 10x5 seocnds for 2 sets  Seated B scapular retraction 10x5 seconds for 3 sets  Seated manually resisted L triceps extension isometrics, L shoulder in slight flexion 10x5 seconds for 2 sets   L lateral neck discomfort   Decreased L radial nerve symptoms with functional IR but has L anterior shoulder discomofrt.         Improved exercise technique, movement at target joints, use  of target muscles after mod verbal, visual, tactile cues.      Manual therapy    Seated STM L upper trap and  B cervical paraspinal muscles to decrease tension        Response to treatment Fair tolerance to today's session         Clinical impression  Decreased L UE radial nerve symptoms with treatment to decrease L scapular protraction as well as promoting gentle posterior glide of L humeral head (manually resisted triceps extension isometrics) during L shoulder functional IR. Still demonstrated L anterior shoulder discomfort during functional L shoulder IR. L lateral neck discomfort however after session. Fair tolerance to today's  session. Pt will benefit from skilled physical therapy services to address the aforementioned deficits.    PATIENT EDUCATION: Education details: there-ex, HEP Person educated: Patient Education method: Explanation, Demonstration, Tactile cues, Verbal cues, and Handouts Education comprehension: verbalized understanding and returned demonstration   HOME EXERCISE PROGRAM: Access Code: [email or web address] URL: [email or web address] Date: 02/12/2022 Prepared by: Joneen Boers  Exercises - Seated Scapular Retraction  - 1 x daily - 7 x weekly - 3 sets - 10 reps - 5 seconds hold   PT Short Term Goals - 01/29/22 1559       PT SHORT TERM GOAL #1   Title Pt will be independent with her initial HEP to decrease pain, improve strength, function, and ability to reach more comfortably.    Baseline Pt has not yet started her HEP (01/29/2022)    Time 3    Period Weeks    Status New    Target Date 02/22/22              PT Long Term Goals - 01/29/22 1600       PT LONG TERM GOAL #1   Title Pt will have a decrease in L shoulder pain to 2/10 or less at worst to promote ability to reach, don and doff shirts more comfortably.    Baseline 8/10 L shoulder pain at worst for the past 3 months (01/29/2022)    Time 8    Period Weeks    Status New    Target Date 03/29/22      PT LONG TERM GOAL #2   Title Pt will have a decrease in R shoulder pain to 1/10 or less at worst to promote ability to reach as well as don and doff shirts more comfortably.    Baseline 3/10 R shoulder pain at worst for the past 3 months (01/29/2022)    Time 8    Period Weeks    Status New    Target Date 03/29/22      PT LONG TERM GOAL #3   Title Pt will improve her shoulder FOTO score by at least 10 points as a demonstration of improved function.    Baseline Shoulder FOTO 51 (01/29/2022)    Time 8    Period Weeks    Status New    Target Date 03/29/22      PT LONG TERM GOAL #4   Title Pt will improve B lower trap  and ER muscle strength by at least 1/2 MMT grade to promote ability to reach more comfortably.    Baseline seated manually resisted scapular retraction targeting lower trap muscle: 4-/5 R and L, ER 4-/5 R, 4/5 L (01/29/2022)    Time 8    Period Weeks    Status New    Target Date 03/29/22  Plan - 02/12/22 1017     Clinical Impression Statement Decreased L UE radial nerve symptoms with treatment to decrease L scapular protraction as well as promoting gentle posterior glide of L humeral head (manually resisted triceps extension isometrics) during L shoulder functional IR. Still demonstrated L anterior shoulder discomfort during functional L shoulder IR. L lateral neck discomfort however after session. Fair tolerance to today's session. Pt will benefit from skilled physical therapy services to address the aforementioned deficits.    Personal Factors and Comorbidities Age;Time since onset of injury/illness/exacerbation    Examination-Activity Limitations Reach Overhead;Dressing;Lift;Carry;Hygiene/Grooming    Stability/Clinical Decision Making Stable/Uncomplicated    Rehab Potential Fair    PT Frequency 2x / week    PT Duration 8 weeks    PT Treatment/Interventions Manual techniques;Therapeutic exercise;Therapeutic activities;Electrical Stimulation;Iontophoresis '4mg'$ /ml Dexamethasone;Neuromuscular re-education;Patient/family education;Dry needling;Spinal Manipulations;Joint Manipulations   manipulation if appropriate   PT Next Visit Plan scapular and rotator cuff strengthening, glenohumeral mechanics, stability. Isometric, concentric, eccentric loading of IR muscles comfortably. Manual techniques, modalities PRN.    Consulted and Agree with Plan of Care Patient              Joneen Boers PT, DPT  02/12/2022, 11:21 AM

## 2022-02-14 ENCOUNTER — Ambulatory Visit: Payer: Medicare Other

## 2022-02-14 DIAGNOSIS — M25511 Pain in right shoulder: Secondary | ICD-10-CM | POA: Diagnosis not present

## 2022-02-14 DIAGNOSIS — M25512 Pain in left shoulder: Secondary | ICD-10-CM | POA: Diagnosis not present

## 2022-02-14 NOTE — Therapy (Signed)
OUTPATIENT PHYSICAL THERAPY TREATMENT NOTE   Patient Name: Kelly James MRN: [id number] DOB:18-Apr-1956, 66 y.o., female Today's Date: 02/14/2022  PCP: Pleas Koch, NP REFERRING PROVIDER: Owens Loffler, MD   PT End of Session - 02/14/22 1016     Visit Number 3    Number of Visits 17    Date for PT Re-Evaluation 03/29/22    Authorization Type 3    Authorization Time Period of 10 progress report    PT Start Time 1017    PT Stop Time 1104    PT Time Calculation (min) 47 min    Activity Tolerance Patient tolerated treatment well    Behavior During Therapy [facility] for tasks assessed/performed              Past Medical History:  Diagnosis Date   Atypical mole 09/10/2019   L ant thigh near the knee    Migraines    UTI (urinary tract infection)    Past Surgical History:  Procedure Laterality Date   TUBAL LIGATION  1984   Patient Active Problem List   Diagnosis Date Noted   Acute shoulder pain 11/03/2021   Palpitations 08/16/2021   Other fatigue 07/05/2021   Rash and nonspecific skin eruption 05/02/2021   Elevated blood pressure reading in office without diagnosis of hypertension 05/02/2021   Frequent headaches 10/21/2020   Joint stiffness 03/03/2020   Genital herpes 03/03/2020   Preventative health care 07/17/2019   Hyperlipidemia 07/17/2019   GERD (gastroesophageal reflux disease) 07/17/2019   Vertigo 08/27/2018   Knee pain 05/03/2017    REFERRING DIAG: M25.511,M25.512 (ICD-10-CM) - Acute pain of both shoulders  THERAPY DIAG:  Acute pain of left shoulder  Acute pain of right shoulder  Rationale for Evaluation and Treatment Rehabilitation  PERTINENT HISTORY: Acute B shoulder pain. L shoulder bothers her more. Woke up one morning several months ago B UE. R UE only at forearm. L UE included L lateral neck, L lateral shoulder and arm and forearm (around radial nerve). Pt also states having L upper trap swelling. Pt works out as often as she can. Has an  old [employer] and does basic workouts there such as squats, supine B shoulder extension. 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  PRECAUTIONS: No known precautions  SUBJECTIVE: L shoulder has the usual pain when driving.   PAIN:  Are you having pain? No pain currently at rest, 5/10 L lateral neck and posterior lateral shoulder and arm when reaching back.      TODAY'S TREATMENT:      Therapeutic exercise  Seated manually resisted scapular retraction targeting lower trap muscles  L 10x3 with 5 second holds  Seated manually resisted L shoulder IR isometrics at 90 degrees elbow flexion and slight shoulder flexion with proper scapular positioning. 10x5 seconds for 2 sets  Seated manually resisted scapular depression isometrics in neutral 10x5 seconds for 3 sets   Decreased L lateral neck/upper trap pain with reaching behind back. Possible L ulnar nerve symptoms reported  Standing L first rib stretch 30 seconds x 4    No L ulnar nerve symptoms with reaching behind her back.      Improved exercise technique, movement at target joints, use of target muscles after mod verbal, visual, tactile cues.      Manual therapy  Seated STM L upper trap and L infraspinatus to decrease tension  Seated STM L anterior shoulder  to improve fascial mobility          Response to treatment Pt tolerated session well without aggravation of symptoms. Decreased L anterior shoulder pain with reaching forward onto the second shelf at wall after session.  Decreased L UE discomfort when reaching behind back after session.        Clinical impression  Worked on improving L lower trap and scapular depressor muscle strength, decreasing L upper trap, L scalene and L infraspinatus muscle tension to decrease stress to her neck as well as L UE peripheral nerves. Worked on improving L anterior shoulder fascial mobility to  promote movement. Pt tolerated session well without aggravation of symptoms. Decreased L anterior shoulder pain with reaching forward onto the second shelf at wall after session.  Decreased overall L UE discomfort when reaching behind back after session.  Pt will benefit from skilled physical therapy services to address the aforementioned deficits.    PATIENT EDUCATION: Education details: there-ex, HEP Person educated: Patient Education method: Explanation, Demonstration, Tactile cues, Verbal cues, and Handouts Education comprehension: verbalized understanding and returned demonstration   HOME EXERCISE PROGRAM: Access Code: [email or web address] URL: [email or web address] Date: 02/12/2022 Prepared by: Joneen Boers  Exercises - Seated Scapular Retraction  - 1 x daily - 7 x weekly - 3 sets - 10 reps - 5 seconds hold   PT Short Term Goals - 01/29/22 1559       PT SHORT TERM GOAL #1   Title Pt will be independent with her initial HEP to decrease pain, improve strength, function, and ability to reach more comfortably.    Baseline Pt has not yet started her HEP (01/29/2022)    Time 3    Period Weeks    Status New    Target Date 02/22/22              PT Long Term Goals - 01/29/22 1600       PT LONG TERM GOAL #1   Title Pt will have a decrease in L shoulder pain to 2/10 or less at worst to promote ability to reach, don and doff shirts more comfortably.    Baseline 8/10 L shoulder pain at worst for the past 3 months (01/29/2022)    Time 8    Period Weeks    Status New    Target Date 03/29/22      PT LONG TERM GOAL #2   Title Pt will have a decrease in R shoulder pain to 1/10 or less at worst to promote ability to reach as well as don and doff shirts more comfortably.    Baseline 3/10 R shoulder pain at worst for the past 3 months (01/29/2022)    Time 8    Period Weeks    Status New    Target Date 03/29/22      PT LONG TERM GOAL #3   Title Pt will improve her shoulder FOTO  score by at least 10 points as a demonstration of improved function.    Baseline Shoulder FOTO 51 (01/29/2022)    Time 8    Period Weeks    Status New    Target Date 03/29/22      PT LONG TERM GOAL #4   Title Pt will improve B lower trap and ER muscle strength by at least 1/2 MMT grade to promote ability to reach more comfortably.    Baseline seated manually resisted scapular retraction targeting lower trap muscle: 4-/5 R and L, ER 4-/5 R, 4/5 L (01/29/2022)  Time 8    Period Weeks    Status New    Target Date 03/29/22              Plan - 02/14/22 1015     Clinical Impression Statement Worked on improving L lower trap and scapular depressor muscle strength, decreasing L upper trap, L scalene and L infraspinatus muscle tension to decrease stress to her neck as well as L UE peripheral nerves. Worked on improving L anterior shoulder fascial mobility to promote movement. Pt tolerated session well without aggravation of symptoms. Decreased L anterior shoulder pain with reaching forward onto the second shelf at wall after session.  Decreased overall L UE discomfort when reaching behind back after session.  Pt will benefit from skilled physical therapy services to address the aforementioned deficits.    Personal Factors and Comorbidities Age;Time since onset of injury/illness/exacerbation    Examination-Activity Limitations Reach Overhead;Dressing;Lift;Carry;Hygiene/Grooming    Stability/Clinical Decision Making Stable/Uncomplicated    Clinical Decision Making Low    Rehab Potential Fair    PT Frequency 2x / week    PT Duration 8 weeks    PT Treatment/Interventions Manual techniques;Therapeutic exercise;Therapeutic activities;Electrical Stimulation;Iontophoresis '4mg'$ /ml Dexamethasone;Neuromuscular re-education;Patient/family education;Dry needling;Spinal Manipulations;Joint Manipulations   manipulation if appropriate   PT Next Visit Plan scapular and rotator cuff strengthening, glenohumeral  mechanics, stability. Isometric, concentric, eccentric loading of IR muscles comfortably. Manual techniques, modalities PRN.    Consulted and Agree with Plan of Care Patient               Joneen Boers PT, DPT  02/14/2022, 11:20 AM

## 2022-02-15 DIAGNOSIS — Z23 Encounter for immunization: Secondary | ICD-10-CM | POA: Diagnosis not present

## 2022-02-19 ENCOUNTER — Ambulatory Visit: Payer: Medicare Other

## 2022-02-19 DIAGNOSIS — M25512 Pain in left shoulder: Secondary | ICD-10-CM | POA: Diagnosis not present

## 2022-02-19 DIAGNOSIS — M25511 Pain in right shoulder: Secondary | ICD-10-CM | POA: Diagnosis not present

## 2022-02-19 NOTE — Therapy (Signed)
OUTPATIENT PHYSICAL THERAPY TREATMENT NOTE   Patient Name: Syniyah Bourne MRN: [id number] DOB:04/15/1956, 66 y.o., female Today's Date: 02/19/2022  PCP: Pleas Koch, NP REFERRING PROVIDER: Owens Loffler, MD   PT End of Session - 02/19/22 1423     Visit Number 4    Number of Visits 17    Date for PT Re-Evaluation 03/29/22    Authorization Type 4    Authorization Time Period of 10 progress report    PT Start Time 7096    PT Stop Time 1502    PT Time Calculation (min) 39 min    Activity Tolerance Patient tolerated treatment well    Behavior During Therapy [facility] for tasks assessed/performed               Past Medical History:  Diagnosis Date   Atypical mole 09/10/2019   L ant thigh near the knee    Migraines    UTI (urinary tract infection)    Past Surgical History:  Procedure Laterality Date   TUBAL LIGATION  1984   Patient Active Problem List   Diagnosis Date Noted   Acute shoulder pain 11/03/2021   Palpitations 08/16/2021   Other fatigue 07/05/2021   Rash and nonspecific skin eruption 05/02/2021   Elevated blood pressure reading in office without diagnosis of hypertension 05/02/2021   Frequent headaches 10/21/2020   Joint stiffness 03/03/2020   Genital herpes 03/03/2020   Preventative health care 07/17/2019   Hyperlipidemia 07/17/2019   GERD (gastroesophageal reflux disease) 07/17/2019   Vertigo 08/27/2018   Knee pain 05/03/2017    REFERRING DIAG: M25.511,M25.512 (ICD-10-CM) - Acute pain of both shoulders  THERAPY DIAG:  Acute pain of left shoulder  Acute pain of right shoulder  Rationale for Evaluation and Treatment Rehabilitation  PERTINENT HISTORY: Acute B shoulder pain. L shoulder bothers her more. Woke up one morning several months ago B UE. R UE only at forearm. L UE included L lateral neck, L lateral shoulder and arm and forearm (around radial nerve). Pt also states having L upper trap swelling. Pt works out as often as she can. Has an  old [employer] and does basic workouts there such as squats, supine B shoulder extension. 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  PRECAUTIONS: No known precautions  SUBJECTIVE: L shoulder is fine. It does not ache. No pain when raising her L arm up (shoulder flexion).   PAIN:  Are you having pain? No pain currently at rest. 3/10 when reaching behind back.      TODAY'S TREATMENT:      Therapeutic exercise  Seated manually resisted scapular depression isometrics in neutral 10x5 seconds for 3 sets  Seated manually resisted L shoulder IR isometrics at 90 degrees elbow flexion and slight shoulder flexion with proper scapular positioning. 10x5 seconds for 3 sets  Seated manually resisted scapular retraction targeting lower trap muscles  L 10x3 with 5 second holds  Standing L first rib stretch with strap 30 seconds x 4   Standing L shoulder IR isometrics, hand on abdomen 10x5 seconds for 2 sets  Decreased L shoulder and arm symptoms with reaching behind back.     Improved exercise technique, movement at target joints, use of target muscles after mod verbal, visual, tactile cues.      Manual therapy  Seated STM L upper trap and L infraspinatus muscles to decrease tension     Response to  treatment Pt tolerated session well without aggravation of symptoms.        Clinical impression  Improved ability to reach behind back with less L shoulder and arm symptoms after exercises to promote increased subscapularis activation. Continued working on improving scapular strength as well as decreasing L scalene and upper trap muscle tension to improve L glenohumeral mechanics. Pt tolerated session well without aggravation of symptoms. Pt will benefit from continued skilled physical therapy services to decrease pain, improve strength function, and ability to reach more comfortably.       PATIENT  EDUCATION: Education details: there-ex, HEP Person educated: Patient Education method: Explanation, Demonstration, Tactile cues, Verbal cues, and Handouts Education comprehension: verbalized understanding and returned demonstration   HOME EXERCISE PROGRAM: Access Code: [email or web address] URL: [email or web address] Date: 02/12/2022 Prepared by: Joneen Boers  Exercises - Seated Scapular Retraction  - 1 x daily - 7 x weekly - 3 sets - 10 reps - 5 seconds hold   PT Short Term Goals - 01/29/22 1559       PT SHORT TERM GOAL #1   Title Pt will be independent with her initial HEP to decrease pain, improve strength, function, and ability to reach more comfortably.    Baseline Pt has not yet started her HEP (01/29/2022)    Time 3    Period Weeks    Status New    Target Date 02/22/22              PT Long Term Goals - 01/29/22 1600       PT LONG TERM GOAL #1   Title Pt will have a decrease in L shoulder pain to 2/10 or less at worst to promote ability to reach, don and doff shirts more comfortably.    Baseline 8/10 L shoulder pain at worst for the past 3 months (01/29/2022)    Time 8    Period Weeks    Status New    Target Date 03/29/22      PT LONG TERM GOAL #2   Title Pt will have a decrease in R shoulder pain to 1/10 or less at worst to promote ability to reach as well as don and doff shirts more comfortably.    Baseline 3/10 R shoulder pain at worst for the past 3 months (01/29/2022)    Time 8    Period Weeks    Status New    Target Date 03/29/22      PT LONG TERM GOAL #3   Title Pt will improve her shoulder FOTO score by at least 10 points as a demonstration of improved function.    Baseline Shoulder FOTO 51 (01/29/2022)    Time 8    Period Weeks    Status New    Target Date 03/29/22      PT LONG TERM GOAL #4   Title Pt will improve B lower trap and ER muscle strength by at least 1/2 MMT grade to promote ability to reach more comfortably.    Baseline seated  manually resisted scapular retraction targeting lower trap muscle: 4-/5 R and L, ER 4-/5 R, 4/5 L (01/29/2022)    Time 8    Period Weeks    Status New    Target Date 03/29/22              Plan - 02/19/22 1428     Clinical Impression Statement Improved ability to reach behind back with less L shoulder and arm symptoms after exercises to promote increased subscapularis  activation. Continued working on improving scapular strength as well as decreasing L scalene and upper trap muscle tension to improve L glenohumeral mechanics. Pt tolerated session well without aggravation of symptoms. Pt will benefit from continued skilled physical therapy services to decrease pain, improve strength function, and ability to reach more comfortably.    Personal Factors and Comorbidities Age;Time since onset of injury/illness/exacerbation    Examination-Activity Limitations Reach Overhead;Dressing;Lift;Carry;Hygiene/Grooming    Stability/Clinical Decision Making Stable/Uncomplicated    Clinical Decision Making Low    Rehab Potential Fair    PT Frequency 2x / week    PT Duration 8 weeks    PT Treatment/Interventions Manual techniques;Therapeutic exercise;Therapeutic activities;Electrical Stimulation;Iontophoresis '4mg'$ /ml Dexamethasone;Neuromuscular re-education;Patient/family education;Dry needling;Spinal Manipulations;Joint Manipulations   manipulation if appropriate   PT Next Visit Plan scapular and rotator cuff strengthening, glenohumeral mechanics, stability. Isometric, concentric, eccentric loading of IR muscles comfortably. Manual techniques, modalities PRN.    PT Home Exercise Plan Medbridge Access Code: [email or web address]    Consulted and Agree with Plan of Care Patient                Joneen Boers PT, DPT  02/19/2022, 3:11 PM

## 2022-02-21 ENCOUNTER — Ambulatory Visit: Payer: Medicare Other

## 2022-02-21 DIAGNOSIS — M25512 Pain in left shoulder: Secondary | ICD-10-CM | POA: Diagnosis not present

## 2022-02-21 DIAGNOSIS — M25511 Pain in right shoulder: Secondary | ICD-10-CM | POA: Diagnosis not present

## 2022-02-21 NOTE — Therapy (Signed)
OUTPATIENT PHYSICAL THERAPY TREATMENT NOTE   Patient Name: Kelly James MRN: [id number] DOB:February 28, 1956, 66 y.o., female Today's Date: 02/21/2022  PCP: Pleas Koch, NP REFERRING PROVIDER: Owens Loffler, MD   PT End of Session - 02/21/22 1019     Visit Number 5    Number of Visits 17    Date for PT Re-Evaluation 03/29/22    Authorization Type 5    Authorization Time Period of 10 progress report    PT Start Time 7062    PT Stop Time 1058    PT Time Calculation (min) 39 min    Activity Tolerance Patient tolerated treatment well    Behavior During Therapy [facility] for tasks assessed/performed                Past Medical History:  Diagnosis Date   Atypical mole 09/10/2019   L ant thigh near the knee    Migraines    UTI (urinary tract infection)    Past Surgical History:  Procedure Laterality Date   TUBAL LIGATION  1984   Patient Active Problem List   Diagnosis Date Noted   Acute shoulder pain 11/03/2021   Palpitations 08/16/2021   Other fatigue 07/05/2021   Rash and nonspecific skin eruption 05/02/2021   Elevated blood pressure reading in office without diagnosis of hypertension 05/02/2021   Frequent headaches 10/21/2020   Joint stiffness 03/03/2020   Genital herpes 03/03/2020   Preventative health care 07/17/2019   Hyperlipidemia 07/17/2019   GERD (gastroesophageal reflux disease) 07/17/2019   Vertigo 08/27/2018   Knee pain 05/03/2017    REFERRING DIAG: M25.511,M25.512 (ICD-10-CM) - Acute pain of both shoulders  THERAPY DIAG:  Acute pain of left shoulder  Acute pain of right shoulder  Rationale for Evaluation and Treatment Rehabilitation  PERTINENT HISTORY: Acute B shoulder pain. L shoulder bothers her more. Woke up one morning several months ago B UE. R UE only at forearm. L UE included L lateral neck, L lateral shoulder and arm and forearm (around radial nerve). Pt also states having L upper trap swelling. Pt works out as often as she can. Has  an old [employer] and does basic workouts there such as squats, supine B shoulder extension. 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  PRECAUTIONS: No known precautions  SUBJECTIVE: L shoulder is a lot better. Woke up this morning and it did not bother her. Also gets cramps in her neck (around SCM muscles B).  PAIN:  Are you having pain? No pain currently at rest. 3/10 (L anterior shoulder pain) when reaching behind back.      TODAY'S TREATMENT:      Therapeutic exercise  Standing L shoulder IR isometrics, hand on abdomen 10x5 seconds for 2 sets  Decreased L shoulder and arm symptoms with reaching behind back.  Standing D2 shoulder extension  L yellow band 5x. L pectoral major muscle cramp sensation    Then cramp free range 10x  L shoulder ER isometrics at doorway 10x5 seconds for 3 sets  Standing L shoulder extension with scapular retraction yellow band 10x3 with 5 seconds  Standing L first rib stretch with strap 30 seconds x 4       Improved exercise technique, movement at target joints, use of target muscles after mod verbal, visual, tactile cues.      Manual therapy Seated STM L upper trap  muscle to decrease tension  Response to treatment Pt tolerated session well without aggravation of symptoms.        Clinical impression  Improving L shoulder pain based on subjective reports. Continued working on improving L rotator cuff and scapular strength as well as decreasing L scalene and upper trap muscle tension to improve L glenohumeral mechanics. Pt tolerated session well without aggravation of symptoms. Pt will benefit from continued skilled physical therapy services to decrease pain, improve strength function, and ability to reach more comfortably.       PATIENT EDUCATION: Education details: there-ex, HEP Person educated: Patient Education method: Explanation, Demonstration,  Tactile cues, Verbal cues, and Handouts Education comprehension: verbalized understanding and returned demonstration   HOME EXERCISE PROGRAM: Access Code: [email or web address] URL: [email or web address] Date: 02/12/2022 Prepared by: Joneen Boers  Exercises - Seated S- First Rib Mobilization with Strap  - 3 x daily - 7 x weekly - 1 sets - 4 reps - 30 seconds hold  capular Retraction  - 1 x daily - 7 x weekly - 3 sets - 4 reps - 5 seconds hold   PT Short Term Goals - 01/29/22 1559       PT SHORT TERM GOAL #1   Title Pt will be independent with her initial HEP to decrease pain, improve strength, function, and ability to reach more comfortably.    Baseline Pt has not yet started her HEP (01/29/2022)    Time 3    Period Weeks    Status New    Target Date 02/22/22              PT Long Term Goals - 01/29/22 1600       PT LONG TERM GOAL #1   Title Pt will have a decrease in L shoulder pain to 2/10 or less at worst to promote ability to reach, don and doff shirts more comfortably.    Baseline 8/10 L shoulder pain at worst for the past 3 months (01/29/2022)    Time 8    Period Weeks    Status New    Target Date 03/29/22      PT LONG TERM GOAL #2   Title Pt will have a decrease in R shoulder pain to 1/10 or less at worst to promote ability to reach as well as don and doff shirts more comfortably.    Baseline 3/10 R shoulder pain at worst for the past 3 months (01/29/2022)    Time 8    Period Weeks    Status New    Target Date 03/29/22      PT LONG TERM GOAL #3   Title Pt will improve her shoulder FOTO score by at least 10 points as a demonstration of improved function.    Baseline Shoulder FOTO 51 (01/29/2022)    Time 8    Period Weeks    Status New    Target Date 03/29/22      PT LONG TERM GOAL #4   Title Pt will improve B lower trap and ER muscle strength by at least 1/2 MMT grade to promote ability to reach more comfortably.    Baseline seated manually resisted  scapular retraction targeting lower trap muscle: 4-/5 R and L, ER 4-/5 R, 4/5 L (01/29/2022)    Time 8    Period Weeks    Status New    Target Date 03/29/22              Plan - 02/21/22 1403     Clinical Impression  Statement Improving L shoulder pain based on subjective reports. Continued working on improving L rotator cuff and scapular strength as well as decreasing L scalene and upper trap muscle tension to improve L glenohumeral mechanics. Pt tolerated session well without aggravation of symptoms. Pt will benefit from continued skilled physical therapy services to decrease pain, improve strength function, and ability to reach more comfortably.    Personal Factors and Comorbidities Age;Time since onset of injury/illness/exacerbation    Examination-Activity Limitations Reach Overhead;Dressing;Lift;Carry;Hygiene/Grooming    Stability/Clinical Decision Making Stable/Uncomplicated    Clinical Decision Making Low    Rehab Potential Fair    PT Frequency 2x / week    PT Duration 8 weeks    PT Treatment/Interventions Manual techniques;Therapeutic exercise;Therapeutic activities;Electrical Stimulation;Iontophoresis '4mg'$ /ml Dexamethasone;Neuromuscular re-education;Patient/family education;Dry needling;Spinal Manipulations;Joint Manipulations   manipulation if appropriate   PT Next Visit Plan scapular and rotator cuff strengthening, glenohumeral mechanics, stability. Isometric, concentric, eccentric loading of IR muscles comfortably. Manual techniques, modalities PRN.    PT Home Exercise Plan Medbridge Access Code: [email or web address]    Consulted and Agree with Plan of Care Patient                 Joneen Boers PT, DPT  02/21/2022, 2:05 PM

## 2022-02-26 ENCOUNTER — Ambulatory Visit: Payer: Medicare Other

## 2022-02-26 DIAGNOSIS — M25512 Pain in left shoulder: Secondary | ICD-10-CM | POA: Diagnosis not present

## 2022-02-26 DIAGNOSIS — M25511 Pain in right shoulder: Secondary | ICD-10-CM

## 2022-02-26 NOTE — Therapy (Signed)
OUTPATIENT PHYSICAL THERAPY TREATMENT NOTE   Patient Name: Kelly James MRN: [id number] DOB:07-13-1955, 66 y.o., female Today's Date: 02/26/2022  PCP: Pleas Koch, NP REFERRING PROVIDER: Owens Loffler, MD   PT End of Session - 02/26/22 1506     Visit Number 6    Number of Visits 17    Date for PT Re-Evaluation 03/29/22    Authorization Type 6    Authorization Time Period of 10 progress report    PT Start Time 1415    PT Stop Time 1500    PT Time Calculation (min) 45 min    Activity Tolerance Patient tolerated treatment well    Behavior During Therapy [facility] for tasks assessed/performed                Past Medical History:  Diagnosis Date   Atypical mole 09/10/2019   L ant thigh near the knee    Migraines    UTI (urinary tract infection)    Past Surgical History:  Procedure Laterality Date   TUBAL LIGATION  1984   Patient Active Problem List   Diagnosis Date Noted   Acute shoulder pain 11/03/2021   Palpitations 08/16/2021   Other fatigue 07/05/2021   Rash and nonspecific skin eruption 05/02/2021   Elevated blood pressure reading in office without diagnosis of hypertension 05/02/2021   Frequent headaches 10/21/2020   Joint stiffness 03/03/2020   Genital herpes 03/03/2020   Preventative health care 07/17/2019   Hyperlipidemia 07/17/2019   GERD (gastroesophageal reflux disease) 07/17/2019   Vertigo 08/27/2018   Knee pain 05/03/2017    REFERRING DIAG: M25.511,M25.512 (ICD-10-CM) - Acute pain of both shoulders  THERAPY DIAG:  Acute pain of left shoulder  Acute pain of right shoulder  Rationale for Evaluation and Treatment Rehabilitation  PERTINENT HISTORY: Acute B shoulder pain. L shoulder bothers her more. Woke up one morning several months ago B UE. R UE only at forearm. L UE included L lateral neck, L lateral shoulder and arm and forearm (around radial nerve). Pt also states having L upper trap swelling. Pt works out as often as she can. Has  an old [employer] and does basic workouts there such as squats, supine B shoulder extension. 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  PRECAUTIONS: No known precautions  SUBJECTIVE: L shoulder overall is feeling better.  She has been working on working on her HEP regularly.  The heaviest thing she had to lift this week was her kitchen aid mixer and it was difficult.    PAIN:  Are you having pain? No pain currently at rest. 3/10 (L anterior shoulder pain) when reaching behind back.     TODAY'S TREATMENT:     Therapeutic exercise  Standing L shoulder IR isometrics, hand on abdomen 10x5 seconds for 2 sets  L shoulder ER isometrics at doorway 10x5 seconds for 3 sets  Standing dynamic isometric with lateral step out, red t-band used; 15x5 seconds  Standing L shoulder extension with scapular retraction red band 10x3 with 5 seconds  Modified CKC scapular protraction (serratus ant) 10x, 2 sets  Standing L first rib stretch with strap 30 seconds x 4   Seated rows OMEGA machine, level 20 resistance 2x8 with tactile cues for technique   Improved exercise technique, movement at target joints, use of target muscles after mod verbal, visual, tactile cues.      Manual therapy (not today) Seated STM  L upper trap  muscle to decrease tension     Response to treatment Pt tolerated session well without aggravation of symptoms.        Clinical impression  Able to progress RTC strengthening today.  PT tactile and verbal cues still required to facilitate reduced UT substitution during scapular mm retraining.  Pt tolerated session well without aggravation of symptoms. Pt will benefit from continued skilled physical therapy services to decrease pain, improve strength function, and ability to reach more comfortably.       PATIENT EDUCATION: Education details: there-ex, HEP Person educated: Patient Education  method: Explanation, Demonstration, Tactile cues, Verbal cues, and Handouts Education comprehension: verbalized understanding and returned demonstration   HOME EXERCISE PROGRAM: Access Code: [email or web address] URL: [email or web address] Date: 02/12/2022 Prepared by: Joneen Boers  Exercises - Seated S- First Rib Mobilization with Strap  - 3 x daily - 7 x weekly - 1 sets - 4 reps - 30 seconds hold  capular Retraction  - 1 x daily - 7 x weekly - 3 sets - 4 reps - 5 seconds hold   PT Short Term Goals - 01/29/22 1559       PT SHORT TERM GOAL #1   Title Pt will be independent with her initial HEP to decrease pain, improve strength, function, and ability to reach more comfortably.    Baseline Pt has not yet started her HEP (01/29/2022)    Time 3    Period Weeks    Status New    Target Date 02/22/22              PT Long Term Goals - 01/29/22 1600       PT LONG TERM GOAL #1   Title Pt will have a decrease in L shoulder pain to 2/10 or less at worst to promote ability to reach, don and doff shirts more comfortably.    Baseline 8/10 L shoulder pain at worst for the past 3 months (01/29/2022)    Time 8    Period Weeks    Status New    Target Date 03/29/22      PT LONG TERM GOAL #2   Title Pt will have a decrease in R shoulder pain to 1/10 or less at worst to promote ability to reach as well as don and doff shirts more comfortably.    Baseline 3/10 R shoulder pain at worst for the past 3 months (01/29/2022)    Time 8    Period Weeks    Status New    Target Date 03/29/22      PT LONG TERM GOAL #3   Title Pt will improve her shoulder FOTO score by at least 10 points as a demonstration of improved function.    Baseline Shoulder FOTO 51 (01/29/2022)    Time 8    Period Weeks    Status New    Target Date 03/29/22      PT LONG TERM GOAL #4   Title Pt will improve B lower trap and ER muscle strength by at least 1/2 MMT grade to promote ability to reach more comfortably.     Baseline seated manually resisted scapular retraction targeting lower trap muscle: 4-/5 R and L, ER 4-/5 R, 4/5 L (01/29/2022)    Time 8    Period Weeks    Status New    Target Date 03/29/22            Merdis Delay, PT, DPT, OCS  [phone]   02/26/2022,  3:07 PM     [facility] [address], [place], [address] Phone: [phone]   Fax:  [phone]  Patient Details  Name: Kelly James MRN: [id number] Date of Birth: February 21, 1956 Referring Provider:  Owens Loffler, MD  Encounter Date: 02/26/2022   Pincus Badder, PT 02/26/2022, 3:07 PM  [facility] [address] [place], [address] Phone: [phone]   Fax:  [phone]

## 2022-02-28 ENCOUNTER — Ambulatory Visit: Payer: Medicare Other

## 2022-02-28 DIAGNOSIS — M25511 Pain in right shoulder: Secondary | ICD-10-CM

## 2022-02-28 DIAGNOSIS — M25512 Pain in left shoulder: Secondary | ICD-10-CM

## 2022-02-28 NOTE — Therapy (Signed)
OUTPATIENT PHYSICAL THERAPY TREATMENT NOTE   Patient Name: Kelly James MRN: [id number] DOB:04-Nov-1955, 66 y.o., female Today's Date: 02/28/2022  PCP: Pleas Koch, NP REFERRING PROVIDER: Owens Loffler, MD   PT End of Session - 02/28/22 1018     Visit Number 7    Number of Visits 17    Date for PT Re-Evaluation 03/29/22    Authorization Type 7    Authorization Time Period of 10 progress report    PT Start Time 1019    PT Stop Time 1100    PT Time Calculation (min) 41 min    Activity Tolerance Patient tolerated treatment well    Behavior During Therapy [facility] for tasks assessed/performed                 Past Medical History:  Diagnosis Date   Atypical mole 09/10/2019   L ant thigh near the knee    Migraines    UTI (urinary tract infection)    Past Surgical History:  Procedure Laterality Date   TUBAL LIGATION  1984   Patient Active Problem List   Diagnosis Date Noted   Acute shoulder pain 11/03/2021   Palpitations 08/16/2021   Other fatigue 07/05/2021   Rash and nonspecific skin eruption 05/02/2021   Elevated blood pressure reading in office without diagnosis of hypertension 05/02/2021   Frequent headaches 10/21/2020   Joint stiffness 03/03/2020   Genital herpes 03/03/2020   Preventative health care 07/17/2019   Hyperlipidemia 07/17/2019   GERD (gastroesophageal reflux disease) 07/17/2019   Vertigo 08/27/2018   Knee pain 05/03/2017    REFERRING DIAG: M25.511,M25.512 (ICD-10-CM) - Acute pain of both shoulders  THERAPY DIAG:  Acute pain of left shoulder  Acute pain of right shoulder  Rationale for Evaluation and Treatment Rehabilitation  PERTINENT HISTORY: Acute B shoulder pain. L shoulder bothers her more. Woke up one morning several months ago B UE. R UE only at forearm. L UE included L lateral neck, L lateral shoulder and arm and forearm (around radial nerve). Pt also states having L upper trap swelling. Pt works out as often as she can. Has  an old [employer] and does basic workouts there such as squats, supine B shoulder extension. 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  PRECAUTIONS: No known precautions  SUBJECTIVE: Felt a little shoulder pain taking her jacket off (just now). Has not done anything since last session. R arm was giving her aching pain yesterday.     PAIN:  Are you having pain? No pain currently at rest. 2/10 (L anterior shoulder pain) when taking her jacket off.    TODAY'S TREATMENT:      Therapeutic exercise   Standing shoulder IR isometrics, hand on abdomen   L 10x5 seconds for 2 sets  R 10x5 seconds for 2 sets  Standing D2 shoulder extension  L yellow band cramp free range 10x3  Decreased discomfort with donning and doffing jacket.   Standing L shoulder extension with scapular retraction yellow band 10x3 with 5 seconds   Seated manually resisted scapular retraction targeting the lower trap muscle  L 10x5 seconds for 2 sets  R 10x5 seconds for 2 sets    Improved exercise technique, movement at target joints, use of target muscles after mod verbal, visual, tactile cues.      Manual therapy  Seated STM L upper trap  muscle to decrease tension and decrease fascial  restrictions.   Seated STM L anterior shoulder to decrease fascial restrictions.    Response to treatment Pt tolerated session well without aggravation of symptoms.        Clinical impression Continued working on improving L rotator cuff and scapular strength as well as decreasing L upper trap muscle tension to improve L glenohumeral mechanics. Pt tolerated session well without aggravation of symptoms. Pt will benefit from continued skilled physical therapy services to decrease pain, improve strength function, and ability to reach more comfortably.       PATIENT EDUCATION: Education details: there-ex, HEP Person educated: Patient Education  method: Explanation, Demonstration, Tactile cues, Verbal cues, and Handouts Education comprehension: verbalized understanding and returned demonstration   HOME EXERCISE PROGRAM: Access Code: [email or web address] URL: [email or web address] Date: 02/12/2022 Prepared by: Joneen Boers  Exercises - Seated S- First Rib Mobilization with Strap  - 3 x daily - 7 x weekly - 1 sets - 4 reps - 30 seconds hold  Scapular Retraction  - 1 x daily - 7 x weekly - 3 sets - 4 reps - 5 seconds hold  - Standing Shoulder Single Arm PNF D2 Extension with Anchored Resistance  - 1 x daily - 7 x weekly - 3 sets - 10 reps  Yellow band     PT Short Term Goals - 01/29/22 1559       PT SHORT TERM GOAL #1   Title Pt will be independent with her initial HEP to decrease pain, improve strength, function, and ability to reach more comfortably.    Baseline Pt has not yet started her HEP (01/29/2022)    Time 3    Period Weeks    Status New    Target Date 02/22/22              PT Long Term Goals - 01/29/22 1600       PT LONG TERM GOAL #1   Title Pt will have a decrease in L shoulder pain to 2/10 or less at worst to promote ability to reach, don and doff shirts more comfortably.    Baseline 8/10 L shoulder pain at worst for the past 3 months (01/29/2022)    Time 8    Period Weeks    Status New    Target Date 03/29/22      PT LONG TERM GOAL #2   Title Pt will have a decrease in R shoulder pain to 1/10 or less at worst to promote ability to reach as well as don and doff shirts more comfortably.    Baseline 3/10 R shoulder pain at worst for the past 3 months (01/29/2022)    Time 8    Period Weeks    Status New    Target Date 03/29/22      PT LONG TERM GOAL #3   Title Pt will improve her shoulder FOTO score by at least 10 points as a demonstration of improved function.    Baseline Shoulder FOTO 51 (01/29/2022)    Time 8    Period Weeks    Status New    Target Date 03/29/22      PT LONG TERM GOAL #4    Title Pt will improve B lower trap and ER muscle strength by at least 1/2 MMT grade to promote ability to reach more comfortably.    Baseline seated manually resisted scapular retraction targeting lower trap muscle: 4-/5 R and L, ER 4-/5 R, 4/5 L (01/29/2022)    Time 8  Period Weeks    Status New    Target Date 03/29/22              Plan - 02/28/22 1018     Clinical Impression Statement Continued working on improving L rotator cuff and scapular strength as well as decreasing L upper trap muscle tension to improve L glenohumeral mechanics. Pt tolerated session well without aggravation of symptoms. Pt will benefit from continued skilled physical therapy services to decrease pain, improve strength function, and ability to reach more comfortably.    Personal Factors and Comorbidities Age;Time since onset of injury/illness/exacerbation    Examination-Activity Limitations Reach Overhead;Dressing;Lift;Carry;Hygiene/Grooming    Stability/Clinical Decision Making Stable/Uncomplicated    Clinical Decision Making Low    Rehab Potential Fair    PT Frequency 2x / week    PT Duration 8 weeks    PT Treatment/Interventions Manual techniques;Therapeutic exercise;Therapeutic activities;Electrical Stimulation;Iontophoresis '4mg'$ /ml Dexamethasone;Neuromuscular re-education;Patient/family education;Dry needling;Spinal Manipulations;Joint Manipulations   manipulation if appropriate   PT Next Visit Plan scapular and rotator cuff strengthening, glenohumeral mechanics, stability. Isometric, concentric, eccentric loading of IR muscles comfortably. Manual techniques, modalities PRN.    PT Home Exercise Plan Medbridge Access Code: [email or web address]    Consulted and Agree with Plan of Care Patient                 Joneen Boers PT, DPT  02/28/2022, 11:12 AM

## 2022-03-05 ENCOUNTER — Ambulatory Visit: Payer: Medicare Other | Attending: Family Medicine

## 2022-03-05 DIAGNOSIS — M25512 Pain in left shoulder: Secondary | ICD-10-CM | POA: Insufficient documentation

## 2022-03-05 DIAGNOSIS — M25511 Pain in right shoulder: Secondary | ICD-10-CM | POA: Diagnosis not present

## 2022-03-05 NOTE — Therapy (Signed)
OUTPATIENT PHYSICAL THERAPY TREATMENT NOTE   Patient Name: Kelly James MRN: [id number] DOB:May 15, 1956, 66 y.o., female Today's Date: 03/05/2022  PCP: Pleas Koch, NP REFERRING PROVIDER: Owens Loffler, MD   PT End of Session - 03/05/22 1419     Visit Number 8    Number of Visits 17    Date for PT Re-Evaluation 03/29/22    Authorization Type 7    Authorization Time Period of 10 progress report    PT Start Time 3559    PT Stop Time 7416    PT Time Calculation (min) 39 min    Activity Tolerance Patient tolerated treatment well    Behavior During Therapy [facility] for tasks assessed/performed                  Past Medical History:  Diagnosis Date   Atypical mole 09/10/2019   L ant thigh near the knee    Migraines    UTI (urinary tract infection)    Past Surgical History:  Procedure Laterality Date   TUBAL LIGATION  1984   Patient Active Problem List   Diagnosis Date Noted   Acute shoulder pain 11/03/2021   Palpitations 08/16/2021   Other fatigue 07/05/2021   Rash and nonspecific skin eruption 05/02/2021   Elevated blood pressure reading in office without diagnosis of hypertension 05/02/2021   Frequent headaches 10/21/2020   Joint stiffness 03/03/2020   Genital herpes 03/03/2020   Preventative health care 07/17/2019   Hyperlipidemia 07/17/2019   GERD (gastroesophageal reflux disease) 07/17/2019   Vertigo 08/27/2018   Knee pain 05/03/2017    REFERRING DIAG: M25.511,M25.512 (ICD-10-CM) - Acute pain of both shoulders  THERAPY DIAG:  Acute pain of left shoulder  Acute pain of right shoulder  Rationale for Evaluation and Treatment Rehabilitation  PERTINENT HISTORY: Acute B shoulder pain. L shoulder bothers her more. Woke up one morning several months ago B UE. R UE only at forearm. L UE included L lateral neck, L lateral shoulder and arm and forearm (around radial nerve). Pt also states having L upper trap swelling. Pt works out as often as she can.  Has an old [employer] and does basic workouts there such as squats, supine B shoulder extension. 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  PRECAUTIONS: No known precautions  SUBJECTIVE: No pain in shoulders currently. Doing pretty good. The shoulder part is getting better. 2/10 L shoulder pain and 1/10 R shoulder pain at worst for the past 7 days. Getting to the point where it does not bother her as much     PAIN:  Are you having pain? No pain currently at rest.    TODAY'S TREATMENT:      Therapeutic exercise  Bent over scaption   R 10x. Difficult  Seated manually resisted scapular retraction targeting the lower trap muscle  L 10x5 seconds for 2 sets  R 10x5 seconds for 2 sets  Standing D2 shoulder extension  L yellow band cramp free range 10x3  Standing shoulder IR isometrics, hand on abdomen   L 10x5 seconds for 2 sets  R 10x5 seconds for 2 sets  Standing B shoulder ER with scapular retraction yellow band 10x5 seconds  L anterior shoulder discomfort, eases with rest.   Standing B scapular retraction red band 10x5 seconds for 2 sets       Improved exercise technique, movement at target joints, use of target muscles  after mod verbal, visual, tactile cues.      Manual therapy Seated STM L upper trap  muscle to decrease tension and decrease fascial restrictions.   Seated STM L anterior shoulder to decrease fascial restrictions.       Response to treatment Pt tolerated session well without aggravation of symptoms.        Clinical impression   Decreasing L and R shoulder pain based on subjective reports. Continued working on improving B shoulder ER, IR, and scapular strength as well as decreasing L upper trap muscle tension and anterior shoulder fascial restrictions to improve glenohumeral movement and decrease stress to affected joints. Pt tolerated session well without aggravation  of symptoms. Pt will benefit from continued skilled physical therapy services to decrease pain, improve strength function, and ability to reach more comfortably.       PATIENT EDUCATION: Education details: there-ex, HEP Person educated: Patient Education method: Explanation, Demonstration, Tactile cues, Verbal cues, and Handouts Education comprehension: verbalized understanding and returned demonstration   HOME EXERCISE PROGRAM: Access Code: [email or web address] URL: [email or web address] Date: 02/12/2022 Prepared by: Joneen Boers  Exercises - Seated S- First Rib Mobilization with Strap  - 3 x daily - 7 x weekly - 1 sets - 4 reps - 30 seconds hold  Scapular Retraction  - 1 x daily - 7 x weekly - 3 sets - 4 reps - 5 seconds hold  - Standing Shoulder Single Arm PNF D2 Extension with Anchored Resistance  - 1 x daily - 7 x weekly - 3 sets - 10 reps  Yellow band     PT Short Term Goals - 01/29/22 1559       PT SHORT TERM GOAL #1   Title Pt will be independent with her initial HEP to decrease pain, improve strength, function, and ability to reach more comfortably.    Baseline Pt has not yet started her HEP (01/29/2022)    Time 3    Period Weeks    Status New    Target Date 02/22/22              PT Long Term Goals - 01/29/22 1600       PT LONG TERM GOAL #1   Title Pt will have a decrease in L shoulder pain to 2/10 or less at worst to promote ability to reach, don and doff shirts more comfortably.    Baseline 8/10 L shoulder pain at worst for the past 3 months (01/29/2022)    Time 8    Period Weeks    Status New    Target Date 03/29/22      PT LONG TERM GOAL #2   Title Pt will have a decrease in R shoulder pain to 1/10 or less at worst to promote ability to reach as well as don and doff shirts more comfortably.    Baseline 3/10 R shoulder pain at worst for the past 3 months (01/29/2022)    Time 8    Period Weeks    Status New    Target Date 03/29/22      PT  LONG TERM GOAL #3   Title Pt will improve her shoulder FOTO score by at least 10 points as a demonstration of improved function.    Baseline Shoulder FOTO 51 (01/29/2022)    Time 8    Period Weeks    Status New    Target Date 03/29/22      PT LONG TERM GOAL #4   Title Pt will  improve B lower trap and ER muscle strength by at least 1/2 MMT grade to promote ability to reach more comfortably.    Baseline seated manually resisted scapular retraction targeting lower trap muscle: 4-/5 R and L, ER 4-/5 R, 4/5 L (01/29/2022)    Time 8    Period Weeks    Status New    Target Date 03/29/22              Plan - 03/05/22 1419     Clinical Impression Statement Decreasing L and R shoulder pain based on subjective reports. Continued working on improving B shoulder ER, IR, and scapular strength as well as decreasing L upper trap muscle tension and anterior shoulder fascial restrictions to improve glenohumeral movement and decrease stress to affected joints. Pt tolerated session well without aggravation of symptoms. Pt will benefit from continued skilled physical therapy services to decrease pain, improve strength function, and ability to reach more comfortably.    Personal Factors and Comorbidities Age;Time since onset of injury/illness/exacerbation    Examination-Activity Limitations Reach Overhead;Dressing;Lift;Carry;Hygiene/Grooming    Stability/Clinical Decision Making Stable/Uncomplicated    Rehab Potential Fair    PT Frequency 2x / week    PT Duration 8 weeks    PT Treatment/Interventions Manual techniques;Therapeutic exercise;Therapeutic activities;Electrical Stimulation;Iontophoresis '4mg'$ /ml Dexamethasone;Neuromuscular re-education;Patient/family education;Dry needling;Spinal Manipulations;Joint Manipulations   manipulation if appropriate   PT Next Visit Plan scapular and rotator cuff strengthening, glenohumeral mechanics, stability. Isometric, concentric, eccentric loading of IR muscles  comfortably. Manual techniques, modalities PRN.    PT Home Exercise Plan Medbridge Access Code: [email or web address]    Consulted and Agree with Plan of Care Patient                  Joneen Boers PT, DPT  03/05/2022, 3:09 PM

## 2022-03-07 ENCOUNTER — Ambulatory Visit: Payer: Medicare Other

## 2022-03-07 DIAGNOSIS — M25512 Pain in left shoulder: Secondary | ICD-10-CM | POA: Diagnosis not present

## 2022-03-07 DIAGNOSIS — M25511 Pain in right shoulder: Secondary | ICD-10-CM

## 2022-03-07 NOTE — Therapy (Signed)
OUTPATIENT PHYSICAL THERAPY TREATMENT NOTE   Patient Name: Kelly James MRN: [id number] DOB:07-Dec-1955, 66 y.o., female Today's Date: 03/07/2022  PCP: Pleas Koch, NP REFERRING PROVIDER: Owens Loffler, MD   PT End of Session - 03/07/22 1106     Visit Number 9    Number of Visits 17    Date for PT Re-Evaluation 03/29/22    Authorization Type 9    Authorization Time Period of 10 progress report    PT Start Time 1106    PT Stop Time 1146    PT Time Calculation (min) 40 min    Activity Tolerance Patient tolerated treatment well    Behavior During Therapy [facility] for tasks assessed/performed                   Past Medical History:  Diagnosis Date   Atypical mole 09/10/2019   L ant thigh near the knee    Migraines    UTI (urinary tract infection)    Past Surgical History:  Procedure Laterality Date   TUBAL LIGATION  1984   Patient Active Problem List   Diagnosis Date Noted   Acute shoulder pain 11/03/2021   Palpitations 08/16/2021   Other fatigue 07/05/2021   Rash and nonspecific skin eruption 05/02/2021   Elevated blood pressure reading in office without diagnosis of hypertension 05/02/2021   Frequent headaches 10/21/2020   Joint stiffness 03/03/2020   Genital herpes 03/03/2020   Preventative health care 07/17/2019   Hyperlipidemia 07/17/2019   GERD (gastroesophageal reflux disease) 07/17/2019   Vertigo 08/27/2018   Knee pain 05/03/2017    REFERRING DIAG: M25.511,M25.512 (ICD-10-CM) - Acute pain of both shoulders  THERAPY DIAG:  Acute pain of left shoulder  Acute pain of right shoulder  Rationale for Evaluation and Treatment Rehabilitation  PERTINENT HISTORY: Acute B shoulder pain. L shoulder bothers her more. Woke up one morning several months ago B UE. R UE only at forearm. L UE included L lateral neck, L lateral shoulder and arm and forearm (around radial nerve). Pt also states having L upper trap swelling. Pt works out as often as she can.  Has an old [employer] and does basic workouts there such as squats, supine B shoulder extension. 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  PRECAUTIONS: No known precautions  SUBJECTIVE: Shoulder feels fine, has pains now and again. No pain at rest. Slight L anterior shoulder pain when putting her jacket to the side.     PAIN:  Are you having pain? See subjective   TODAY'S TREATMENT:      Therapeutic exercise  Decreased L shoulder pain with putting her jacket to the side with scapular retraction   Standing shoulder scaption sliding hand on stair rail with scapular retraction   L 10x3  R 10x3  Standing shoulder flexion sliding hand on stair rail with scapular retraction   L 10x3  R 10x3  Standing B scapular retraction red band 10x5 seconds for 3 sets   Standing shoulder ER isometrics in neutral, forearm at doorway or wall   L 10x5 seconds for 2 sets    Standing shoulder IR isometrics, hand on abdomen   L 10x5 seconds for 2 sets  R 10x5 seconds for 2 sets    Shoulder FOTO score: 58 (03/07/2022)       Improved exercise technique, movement at target joints, use of target muscles after mod verbal, visual,  tactile cues.       Response to treatment Pt tolerated session well without aggravation of symptoms.        Clinical impression  Worked on improving B scapular positioning and glenohumeral control when reaching. Also worked on [employer] and ER strengthening to promote better intraarticular mechanics with arm movements.  Pt tolerated session well without aggravation of symptoms. Pt will benefit from continued skilled physical therapy services to decrease pain, improve strength function, and ability to reach more comfortably.       PATIENT EDUCATION: Education details: there-ex, HEP Person educated: Patient Education method: Explanation, Demonstration, Tactile cues, Verbal cues, and  Handouts Education comprehension: verbalized understanding and returned demonstration   HOME EXERCISE PROGRAM: Access Code: [email or web address] URL: [email or web address] Date: 02/12/2022 Prepared by: Joneen Boers  Exercises - Seated S- First Rib Mobilization with Strap  - 3 x daily - 7 x weekly - 1 sets - 4 reps - 30 seconds hold  Scapular Retraction  - 1 x daily - 7 x weekly - 3 sets - 4 reps - 5 seconds hold  - Standing Shoulder Single Arm PNF D2 Extension with Anchored Resistance  - 1 x daily - 7 x weekly - 3 sets - 10 reps  Yellow band  Standing shoulder scaption sliding hand on stair rail with scapular retraction   L 10x3  R 10x3  Standing shoulder flexion sliding hand on stair rail with scapular retraction   L 10x3  R 10x3       PT Short Term Goals - 01/29/22 1559       PT SHORT TERM GOAL #1   Title Pt will be independent with her initial HEP to decrease pain, improve strength, function, and ability to reach more comfortably.    Baseline Pt has not yet started her HEP (01/29/2022)    Time 3    Period Weeks    Status New    Target Date 02/22/22              PT Long Term Goals - 01/29/22 1600       PT LONG TERM GOAL #1   Title Pt will have a decrease in L shoulder pain to 2/10 or less at worst to promote ability to reach, don and doff shirts more comfortably.    Baseline 8/10 L shoulder pain at worst for the past 3 months (01/29/2022)    Time 8    Period Weeks    Status New    Target Date 03/29/22      PT LONG TERM GOAL #2   Title Pt will have a decrease in R shoulder pain to 1/10 or less at worst to promote ability to reach as well as don and doff shirts more comfortably.    Baseline 3/10 R shoulder pain at worst for the past 3 months (01/29/2022)    Time 8    Period Weeks    Status New    Target Date 03/29/22      PT LONG TERM GOAL #3   Title Pt will improve her shoulder FOTO score by at least 10 points as a demonstration of improved function.     Baseline Shoulder FOTO 51 (01/29/2022)    Time 8    Period Weeks    Status New    Target Date 03/29/22      PT LONG TERM GOAL #4   Title Pt will improve B lower trap and ER muscle strength by at least 1/2 MMT grade to  promote ability to reach more comfortably.    Baseline seated manually resisted scapular retraction targeting lower trap muscle: 4-/5 R and L, ER 4-/5 R, 4/5 L (01/29/2022)    Time 8    Period Weeks    Status New    Target Date 03/29/22              Plan - 03/07/22 1119     Clinical Impression Statement Worked on improving B scapular positioning and glenohumeral control when reaching. Also worked on [employer] and ER strengthening to promote better intraarticular mechanics with arm movements.  Pt tolerated session well without aggravation of symptoms. Pt will benefit from continued skilled physical therapy services to decrease pain, improve strength function, and ability to reach more comfortably.    Personal Factors and Comorbidities Age;Time since onset of injury/illness/exacerbation    Examination-Activity Limitations Reach Overhead;Dressing;Lift;Carry;Hygiene/Grooming    Stability/Clinical Decision Making Stable/Uncomplicated    Clinical Decision Making Low    Rehab Potential Fair    PT Frequency 2x / week    PT Duration 8 weeks    PT Treatment/Interventions Manual techniques;Therapeutic exercise;Therapeutic activities;Electrical Stimulation;Iontophoresis '4mg'$ /ml Dexamethasone;Neuromuscular re-education;Patient/family education;Dry needling;Spinal Manipulations;Joint Manipulations   manipulation if appropriate   PT Next Visit Plan scapular and rotator cuff strengthening, glenohumeral mechanics, stability. Isometric, concentric, eccentric loading of IR muscles comfortably. Manual techniques, modalities PRN.    PT Home Exercise Plan Medbridge Access Code: [email or web address]    Consulted and Agree with Plan of Care Patient                   Joneen Boers PT,  DPT  03/07/2022, 11:53 AM

## 2022-03-12 ENCOUNTER — Ambulatory Visit: Payer: Medicare Other

## 2022-03-14 ENCOUNTER — Ambulatory Visit: Payer: Medicare Other

## 2022-03-14 DIAGNOSIS — M25511 Pain in right shoulder: Secondary | ICD-10-CM

## 2022-03-14 DIAGNOSIS — M25512 Pain in left shoulder: Secondary | ICD-10-CM | POA: Diagnosis not present

## 2022-03-14 NOTE — Therapy (Signed)
OUTPATIENT PHYSICAL THERAPY TREATMENT NOTE And Progress Report (01/29/2022 - 03/14/2022) And Discharge Summary   Patient Name: Kelly James MRN: [id number] DOB:06-Feb-1956, 66 y.o., female Today's Date: 03/14/2022  PCP: Pleas Koch, NP REFERRING PROVIDER: Owens Loffler, MD   PT End of Session - 03/14/22 1416     Visit Number 10    Number of Visits 17    Date for PT Re-Evaluation 03/29/22    Authorization Type 10    Authorization Time Period of 10 progress report    PT Start Time 1416    PT Stop Time 1505    PT Time Calculation (min) 49 min    Activity Tolerance Patient tolerated treatment well    Behavior During Therapy [facility] for tasks assessed/performed                    Past Medical History:  Diagnosis Date   Atypical mole 09/10/2019   L ant thigh near the knee    Migraines    UTI (urinary tract infection)    Past Surgical History:  Procedure Laterality Date   TUBAL LIGATION  1984   Patient Active Problem List   Diagnosis Date Noted   Acute shoulder pain 11/03/2021   Palpitations 08/16/2021   Other fatigue 07/05/2021   Rash and nonspecific skin eruption 05/02/2021   Elevated blood pressure reading in office without diagnosis of hypertension 05/02/2021   Frequent headaches 10/21/2020   Joint stiffness 03/03/2020   Genital herpes 03/03/2020   Preventative health care 07/17/2019   Hyperlipidemia 07/17/2019   GERD (gastroesophageal reflux disease) 07/17/2019   Vertigo 08/27/2018   Knee pain 05/03/2017    REFERRING DIAG: M25.511,M25.512 (ICD-10-CM) - Acute pain of both shoulders  THERAPY DIAG:  Acute pain of left shoulder  Acute pain of right shoulder  Rationale for Evaluation and Treatment Rehabilitation  PERTINENT HISTORY: Acute B shoulder pain. L shoulder bothers her more. Woke up one morning several months ago B UE. R UE only at forearm. L UE included L lateral neck, L lateral shoulder and arm and forearm (around radial nerve).  Pt also states having L upper trap swelling. Pt works out as often as she can. Has an old [employer] and does basic workouts there such as squats, supine B shoulder extension. 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  PRECAUTIONS: No known precautions  SUBJECTIVE: L shoulder bothered her this morning, not bothering her now. Was doing B shoulder abduction 2 lbs each hands this morning. 3-4/10 L anterior shoulder pain currently when raising her arm up to the side. This morning has been crazy (involve mother in law). Feels like she can continue with her exercises at home.      PAIN:  Are you having pain? See subjective   TODAY'S TREATMENT:      Therapeutic exercise  L shoulder abduction. Pt demonstrates anterior tipping   L shoulder abductino with 2 lbs with scapular retraction, decreased pain  Standing B scapular retraction red band 10x5 seconds   Manually resisted lower trap and ER  Shoulder AROM  Flexion: R 161 degrees; L 154 degrees Abduction: R 178 degrees, L 180 degrees  Functional IR L: L thumb to T6 spinous process with L arm pain.   Reviewed progress/current status  with PT towards goals   Standing shoulder scaption sliding hand on stair rail with scapular retraction   L 10x3  R 10x3  Standing shoulder flexion sliding hand on stair rail with scapular retraction   L 10x3  R 10x3    Improved exercise technique, movement at target joints, use of target muscles after mod verbal, visual, tactile cues.      Manual therapy   Seated STM L upper trap  muscle to decrease tension and decrease fascial restrictions.              Response to treatment Pt tolerated session well without aggravation of symptoms.        Clinical impression Pt demonstrates decreased L and R shoulder pain, improved B shoulder strength, AROM and function since initial evaluation. Pt has has achieved all  goals. Skilled physical therapy services discharged with pt continuing progress with her exercises at home.     PATIENT EDUCATION: Education details: there-ex, HEP Person educated: Patient Education method: Explanation, Demonstration, Tactile cues, Verbal cues, and Handouts Education comprehension: verbalized understanding and returned demonstration   HOME EXERCISE PROGRAM: Access Code: [email or web address] URL: [email or web address] Date: 02/12/2022 Prepared by: Joneen Boers  Exercises - Seated S- First Rib Mobilization with Strap  - 3 x daily - 7 x weekly - 1 sets - 4 reps - 30 seconds hold  Scapular Retraction  - 1 x daily - 7 x weekly - 3 sets - 4 reps - 5 seconds hold  - Standing Shoulder Single Arm PNF D2 Extension with Anchored Resistance  - 1 x daily - 7 x weekly - 3 sets - 10 reps  Yellow band  Standing shoulder scaption sliding hand on stair rail with scapular retraction   L 10x3  R 10x3  Standing shoulder flexion sliding hand on stair rail with scapular retraction   L 10x3  R 10x3   - Scapular Retraction with Resistance  - 1 x daily - 7 x weekly - 3 sets - 10 reps - 5 seconds hold    PT Short Term Goals - 01/29/22 1559       PT SHORT TERM GOAL #1   Title Pt will be independent with her initial HEP to decrease pain, improve strength, function, and ability to reach more comfortably.    Baseline Pt has not yet started her HEP (01/29/2022)    Time 3    Period Weeks    Status New    Target Date 02/22/22              PT Long Term Goals - 03/14/22 1426       PT LONG TERM GOAL #1   Title Pt will have a decrease in L shoulder pain to 2/10 or less at worst to promote ability to reach, don and doff shirts more comfortably.    Baseline 8/10 L shoulder pain at worst for the past 3 months (01/29/2022); 1/10 L shoulder pain at worst for the past 7 days (03/14/2022)    Time 8    Period Weeks    Status Achieved    Target Date 03/29/22      PT LONG TERM GOAL  #2   Title Pt will have a decrease in R shoulder pain to 1/10 or less at worst to promote ability to reach as well as don and doff shirts more comfortably.    Baseline 3/10 R shoulder pain at worst for the past 3 months (01/29/2022); 0/10 R shoulder pain at worst for the past 7 days (03/14/2022)    Time 8    Period Weeks    Status Achieved    Target  Date 03/29/22      PT LONG TERM GOAL #3   Title Pt will improve her shoulder FOTO score by at least 10 points as a demonstration of improved function.    Baseline Shoulder FOTO 51 (01/29/2022); 68 (03/14/2022)    Time 8    Period Weeks    Status Achieved    Target Date 03/29/22      PT LONG TERM GOAL #4   Title Pt will improve B lower trap and ER muscle strength by at least 1/2 MMT grade to promote ability to reach more comfortably.    Baseline seated manually resisted scapular retraction targeting lower trap muscle: 4-/5 R and L, ER 4-/5 R, 4/5 L (01/29/2022); seated manually resisted scapular retraction targeting lower trap muscle: 4/5 R and L, ER 4/5, 4+/5 L (03/14/2022)    Time 8    Period Weeks    Status Achieved    Target Date 03/29/22              Plan - 03/14/22 1416     Clinical Impression Statement Pt demonstrates decreased L and R shoulder pain, improved B shoulder strength, AROM and function since initial evaluation. Pt has has achieved all goals. Skilled physical therapy services discharged with pt continuing progress with her exercises at home.    Personal Factors and Comorbidities Age;Time since onset of injury/illness/exacerbation    Examination-Activity Limitations Reach Overhead;Dressing;Lift;Carry;Hygiene/Grooming    Stability/Clinical Decision Making Stable/Uncomplicated    Clinical Decision Making Low    Rehab Potential --    PT Frequency --    PT Duration --    PT Treatment/Interventions Manual techniques;Therapeutic exercise;Therapeutic activities;Neuromuscular re-education;Patient/family education    manipulation if appropriate   PT Next Visit Plan Continue progress with [place] Access Code: [email or web address]    Consulted and Agree with Plan of Care Patient                 Thank you for your referral.    Joneen Boers PT, DPT  03/14/2022, 3:18 PM

## 2022-03-18 DIAGNOSIS — Z23 Encounter for immunization: Secondary | ICD-10-CM | POA: Diagnosis not present

## 2022-03-20 IMAGING — MR MR HEAD W/O CM
13 series · 48 of 48 positions shown · non-contrast
Comparison: Report from head CT 10/20/2003 (images unavailable).

CLINICAL DATA: Headache, new or worsening. Additional history
provided by scanning technologist: Patient reports new daily
headache for 2 months.

EXAM:
MRI HEAD WITHOUT CONTRAST
TECHNIQUE: Multiplanar, multiecho pulse sequences of the brain and surrounding
structures were obtained without intravenous contrast.

[Series 5: ax dwi_tracew · axial · 3.0mm · 0.65mm/px · z∈[-48,+107]mm · 2 of 48 slices shown]
[im 1/48]
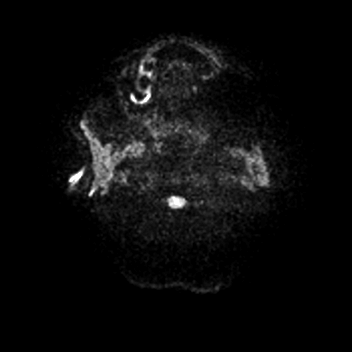
[im 48/48]
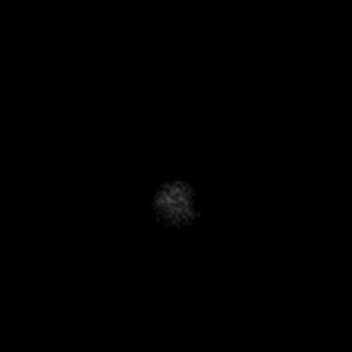

[Series 6: ax dwi_adc · axial · 3.0mm · 0.65mm/px · z∈[-48,+104]mm · 3 of 47 slices shown]
[im 1/47]
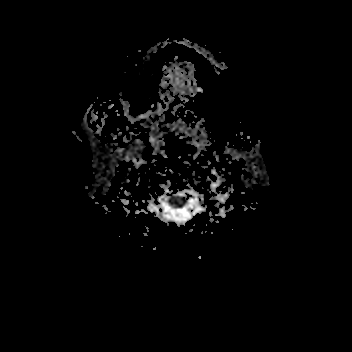
[im 24/47]
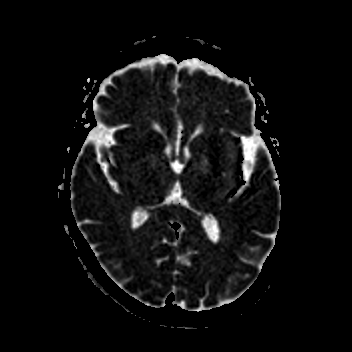
[im 47/47]
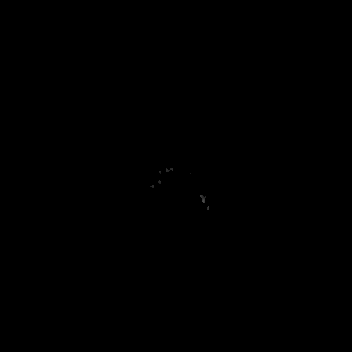

[Series 7: cor dwi_tracew · coronal · 5.0mm · 0.65mm/px · 3 of 40 slices shown]
[im 1/40]
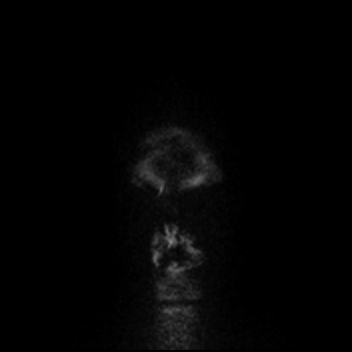
[im 20/40]
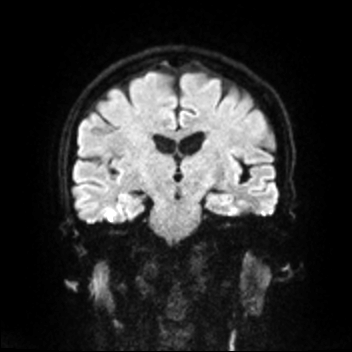
[im 40/40]
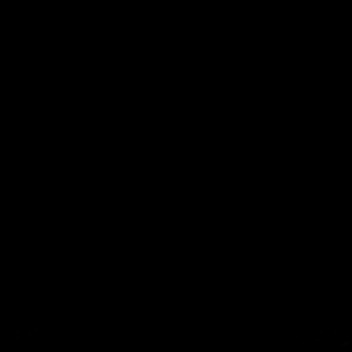

[Series 8: cor dwi_adc · coronal · 5.0mm · 0.65mm/px · 2 of 36 slices shown]
[im 1/36]
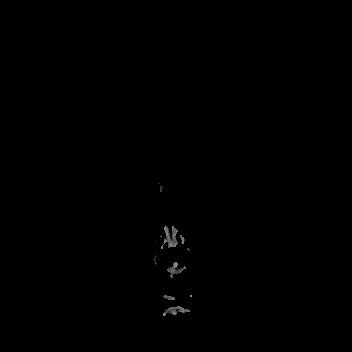
[im 36/36]
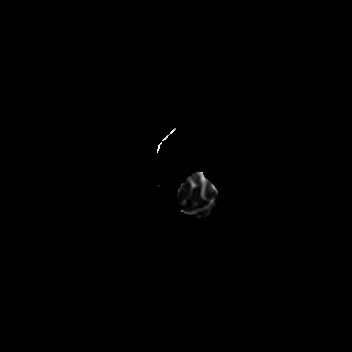

[Series 9: T1 · sagittal · 5.0mm · 0.62mm/px · 2 of 24 slices shown (1 of 2)]
[im 1/24]
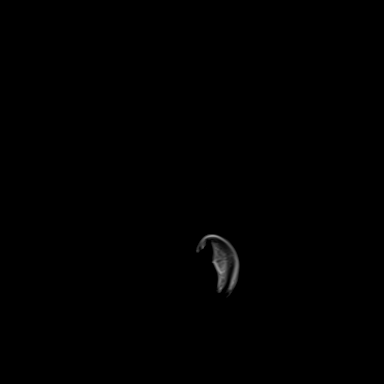
[im 24/24]
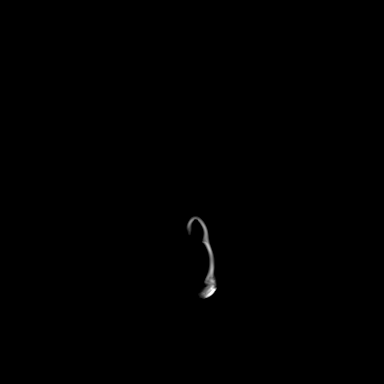

[Series 10: T2 · axial · 5.0mm · 0.53mm/px · z∈[-40,+104]mm · 2 of 25 slices shown (1 of 2)]
[im 1/25]
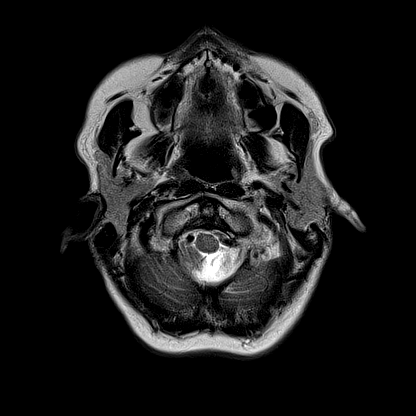
[im 25/25]
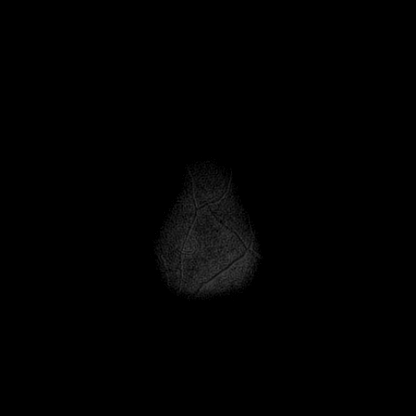

[Series 11: mag_images · axial · 3.0mm · 0.90mm/px · z∈[-57,+120]mm · 4 of 60 slices shown]
[im 1/60]
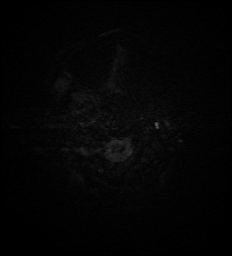
[im 20/60]
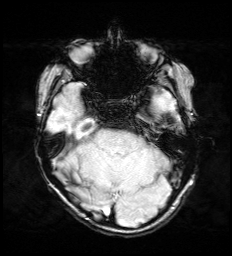
[im 40/60]
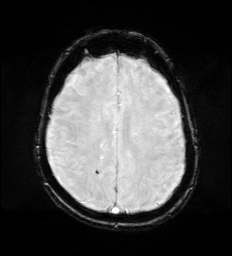
[im 60/60]
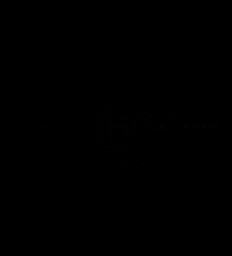

[Series 12: pha_images · axial · 3.0mm · 0.90mm/px · z∈[-57,+114]mm · 4 of 57 slices shown]
[im 1/57]
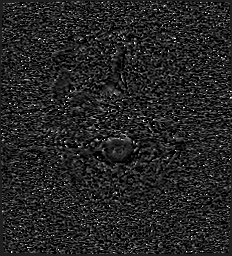
[im 19/57]
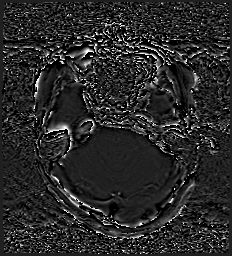
[im 38/57]
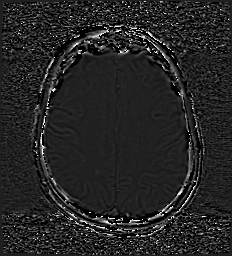
[im 57/57]
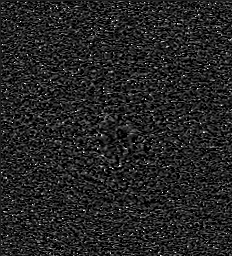

[Series 13: swi_images · axial · 3.0mm · 0.90mm/px · z∈[-57,+120]mm · 4 of 60 slices shown]
[im 1/60]
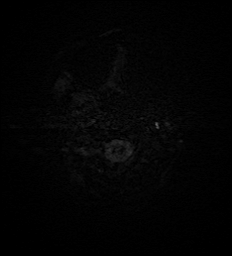
[im 20/60]
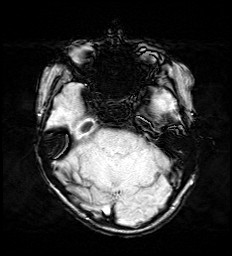
[im 40/60]
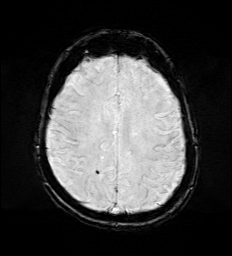
[im 60/60]
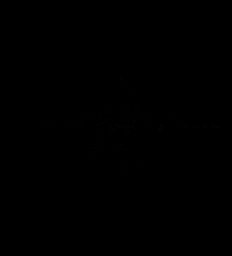

[Series 14: mip_images(sw) · axial · 24.0mm · 0.90mm/px · z∈[-46,+110]mm · 4 of 53 slices shown]
[im 1/53]
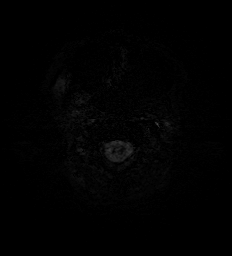
[im 18/53]
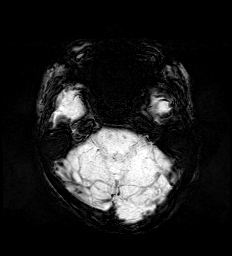
[im 35/53]
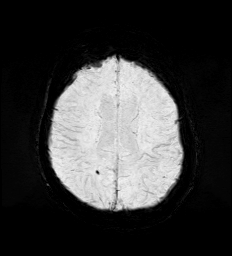
[im 53/53]
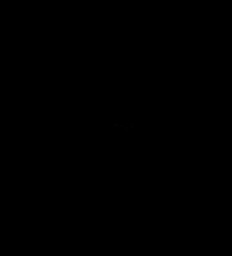

[Series 15: FLAIR · axial · 3.0mm · 0.53mm/px · z∈[-49,+113]mm · 4 of 55 slices shown]
[im 1/55]
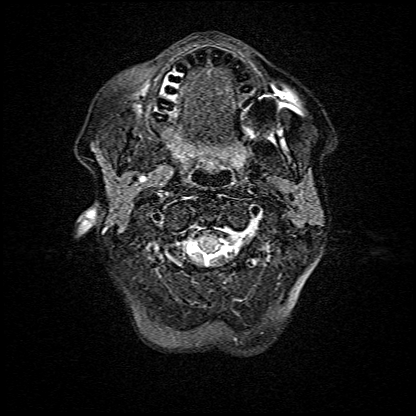
[im 19/55]
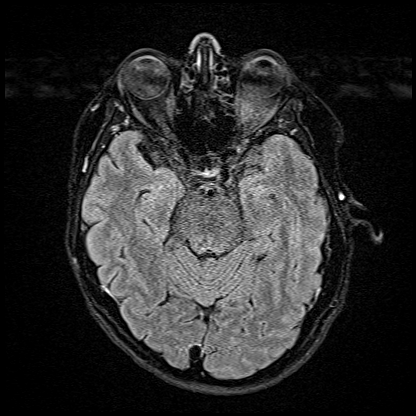
[im 37/55]
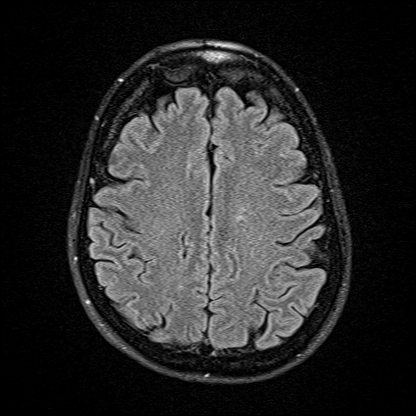
[im 55/55]
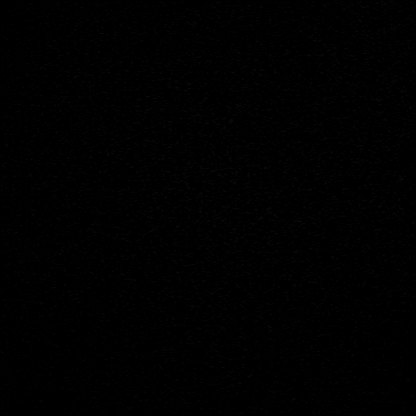

[Series 16: T1 · axial · 1.0mm · 0.98mm/px · z∈[-58,+117]mm · 12 of 176 slices shown (2 of 2)]
[im 1/176]
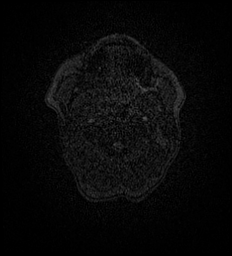
[im 16/176]
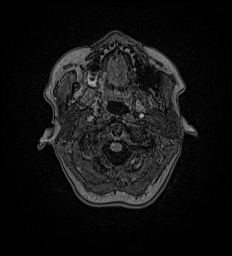
[im 32/176]
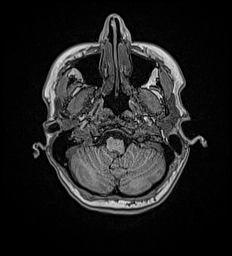
[im 48/176]
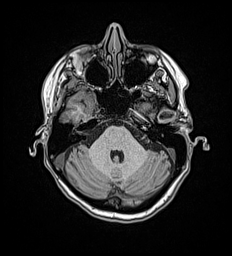
[im 64/176]
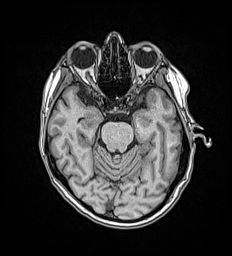
[im 80/176]
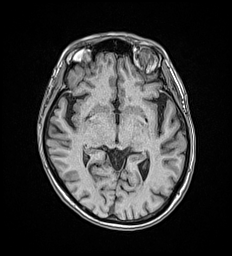
[im 96/176]
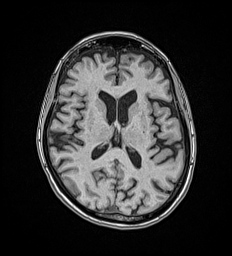
[im 112/176]
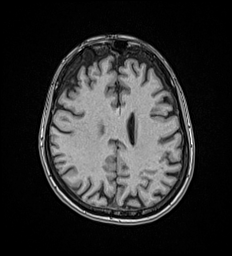
[im 128/176]
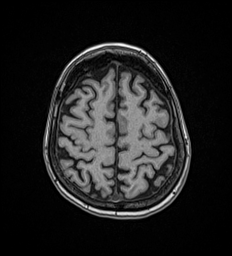
[im 144/176]
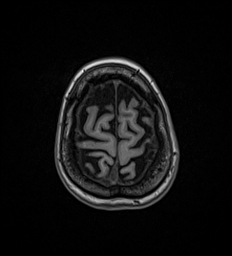
[im 160/176]
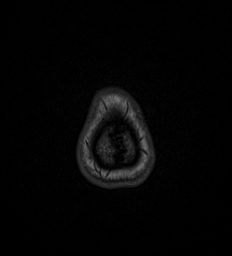
[im 176/176]
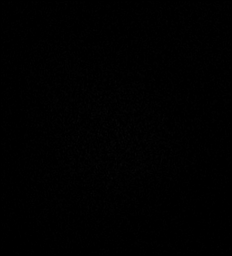

[Series 17: T2 · coronal · 5.0mm · 0.57mm/px · 2 of 29 slices shown (2 of 2)]
[im 1/29]
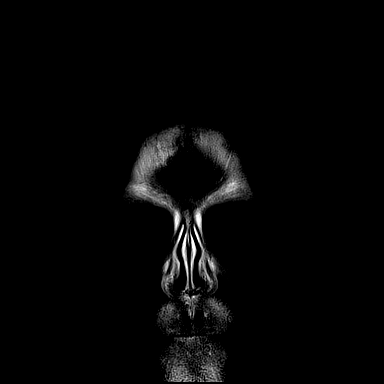
[im 29/29]
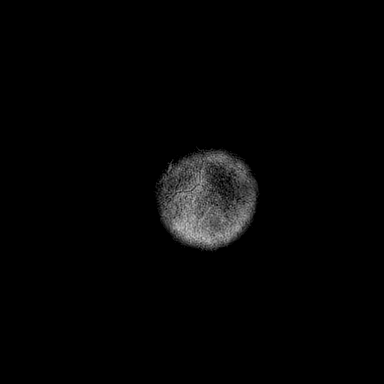

[48 of 48 positions shown; findings below may reference images not displayed]

FINDINGS: Brain:

Cerebral volume is normal for age.

Mild multifocal T2/FLAIR hyperintensity within the cerebral white
matter, nonspecific but most often secondary to chronic small vessel
ischemia.

2 mm focus of SWI signal loss within the right parietal lobe
compatible with chronic microhemorrhage (series 13, image 40).

1.3 x 0.8 cm focus of extra-axial CSF intensity prominence overlying
the anterior right frontal lobe (with associated subtle calvarial
remodeling), suspicious for small incidental arachnoid cyst.

There is no acute infarct.

No evidence of an intracranial mass.

No extra-axial fluid collection.

No midline shift.

Vascular: Expected proximal arterial flow voids.

Skull and upper cervical spine: No focal marrow lesion.

Sinuses/Orbits: Visualized orbits show no acute finding. No
significant paranasal sinus disease.
IMPRESSION: No evidence of acute intracranial abnormality.

Mild multifocal T2/FLAIR hyperintensity within the cerebral white
matter, nonspecific but most often secondary to chronic small vessel
ischemia.

2 mm chronic microhemorrhage within the right parietal lobe.

Suspected 1.3 cm incidental arachnoid cyst overlying the anterior
right frontal lobe.

Otherwise unremarkable noncontrast MRI appearance of the brain.

## 2022-05-02 NOTE — Telephone Encounter (Signed)
Frederico Hamman, see message.

## 2022-05-08 NOTE — Progress Notes (Unsigned)
    [name] T. Hollynn Garno, MD, [place] at [facility] [place], [address]  Phone: [phone]  FAX: [phone]  Kelly James - 66 y.o. female  MRN [id number]  Date of Birth: Jun 26, 1955  Date: 05/09/2022  PCP: Pleas Koch, NP  Referral: Pleas Koch, NP  No chief complaint on file.  Subjective:   Kelly James is a 66 y.o. very pleasant female patient with There is no height or weight on file to calculate BMI. who presents with the following:  Patient is here I saw her once roughly 6 months ago for what I felt was some impingement syndrome of the left shoulder.  She is here today to follow-up on this as well as some other pain complaints.  We have communicated a number of times via [employer].  She recently finished a round of physical therapy for the upper extremities.    Review of Systems is noted in the HPI, as appropriate  Objective:   There were no vitals taken for this visit.  GEN: No acute distress; alert,appropriate. PULM: Breathing comfortably in no respiratory distress PSYCH: Normally interactive.   Laboratory and Imaging Data:  Assessment and Plan:   ***

## 2022-05-09 ENCOUNTER — Ambulatory Visit (INDEPENDENT_AMBULATORY_CARE_PROVIDER_SITE_OTHER): Payer: Medicare Other | Admitting: Family Medicine

## 2022-05-09 ENCOUNTER — Encounter: Payer: Self-pay | Admitting: Family Medicine

## 2022-05-09 VITALS — BP 114/74 | HR 87 | Temp 98.4°F | Ht 60.0 in | Wt 130.1 lb

## 2022-05-09 DIAGNOSIS — M79601 Pain in right arm: Secondary | ICD-10-CM | POA: Diagnosis not present

## 2022-05-09 DIAGNOSIS — G8929 Other chronic pain: Secondary | ICD-10-CM

## 2022-05-09 NOTE — Patient Instructions (Signed)
Tart cherry juice concentrate - you could try this with ibuprofen or by itself.

## 2022-06-20 ENCOUNTER — Ambulatory Visit (INDEPENDENT_AMBULATORY_CARE_PROVIDER_SITE_OTHER): Payer: Medicare Other | Admitting: Primary Care

## 2022-06-20 ENCOUNTER — Encounter: Payer: Self-pay | Admitting: Primary Care

## 2022-06-20 VITALS — BP 128/82 | HR 88 | Temp 98.6°F | Ht 60.0 in | Wt 130.0 lb

## 2022-06-20 DIAGNOSIS — R350 Frequency of micturition: Secondary | ICD-10-CM

## 2022-06-20 DIAGNOSIS — N3946 Mixed incontinence: Secondary | ICD-10-CM | POA: Diagnosis not present

## 2022-06-20 DIAGNOSIS — R3915 Urgency of urination: Secondary | ICD-10-CM | POA: Diagnosis not present

## 2022-06-20 DIAGNOSIS — R32 Unspecified urinary incontinence: Secondary | ICD-10-CM | POA: Insufficient documentation

## 2022-06-20 LAB — POC URINALSYSI DIPSTICK (AUTOMATED)
Bilirubin, UA: NEGATIVE
Blood, UA: NEGATIVE
Glucose, UA: NEGATIVE
Ketones, UA: NEGATIVE
Leukocytes, UA: NEGATIVE
Nitrite, UA: NEGATIVE
Protein, UA: NEGATIVE
Spec Grav, UA: <=1.005 — AB (ref 1.010–1.025)
Urobilinogen, UA: 0.2 E.U./dL
pH, UA: 6 (ref 5.0–8.0)

## 2022-06-20 NOTE — Progress Notes (Signed)
Subjective:    Patient ID: Kelly James, female    DOB: 1956-04-06, 68 y.o.   MRN: [id number]  Urinary Frequency  Associated symptoms include frequency and urgency. Pertinent negatives include no flank pain or hematuria.    Kelly James is a very pleasant 67 y.o. female  has a past medical history of Atypical mole (09/10/2019), Migraines, and UTI (urinary tract infection). who presents today to discuss urinary frequency.   Symptom onset three months ago. She drinks about 60-80 oz of water daily which is an increase from prior. She also drinks 2 cups of coffee and Russian tea throughout the day.  She has noticed intermittent urinary urgency with urinary incontinence. Also notices incontinence with cough or sneezing on occasion.   She denies dysuria, flank pain, hematuria, chills. She does not wear a pad.    Review of Systems  Gastrointestinal:  Negative for abdominal pain.  Genitourinary:  Positive for frequency and urgency. Negative for dysuria, flank pain and hematuria.         Past Medical History:  Diagnosis Date   Atypical mole 09/10/2019   L ant thigh near the knee    Migraines    UTI (urinary tract infection)     Social History   Socioeconomic History   Marital status: Married    Spouse name: Not on file   Number of children: Not on file   Years of education: Not on file   Highest education level: Not on file  Occupational History   Not on file  Tobacco Use   Smoking status: Never   Smokeless tobacco: Never  Vaping Use   Vaping Use: Never used  Substance and Sexual Activity   Alcohol use: No   Drug use: No   Sexual activity: Not on file  Other Topics Concern   Not on file  Social History Narrative   Married.   2 children, 2 grandchildren.   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   Social Determinants of Health   Financial Resource Strain: Low Risk  (12/21/2021)   Overall Financial Resource Strain (CARDIA)    Difficulty of Paying  Living Expenses: Not hard at all  Food Insecurity: No Food Insecurity (12/21/2021)   Hunger Vital Sign    Worried About Running Out of Food in the Last Year: Never true    Ran Out of Food in the Last Year: Never true  Transportation Needs: No Transportation Needs (12/21/2021)   PRAPARE - Hydrologist (Medical): No    Lack of Transportation (Non-Medical): No  Physical Activity: Insufficiently Active (12/21/2021)   Exercise Vital Sign    Days of Exercise per Week: 4 days    Minutes of Exercise per Session: 30 min  Stress: No Stress Concern Present (12/21/2021)   [place]    Feeling of Stress : Not at all  Social Connections: Not on file  Intimate Partner Violence: Not on file    Past Surgical History:  Procedure Laterality Date   TUBAL LIGATION  1984    Family History  Problem Relation Age of Onset   Arthritis Mother    Alzheimer's disease Mother    Alcohol abuse Father    Asthma Sister    Asthma Brother     Allergies  Allergen Reactions   Codeine     Other reaction(s): Agitation   Sulfa Antibiotics Hives    Current Outpatient Medications on  File Prior to Visit  Medication Sig Dispense Refill   acyclovir (ZOVIRAX) 400 MG tablet Take 400 mg by mouth 2 (two) times daily.     ALFALFA PO Take by mouth.     Ascorbic Acid (CHEWABLE VITAMIN C PO) Take 100 mg by mouth.     Ascorbic Acid (VITA-C PO)      B Complex-Biotin-FA (B COMPLETE PO) Take by mouth.     CALCIUM PO Take 1,560 mg by mouth daily.     Cholecalciferol (D3-1000 PO) Take 1 tablet by mouth daily. Vitamin D     Misc Natural Products (JOINT SUPPORT COMPLEX PO) Take by mouth.     Nutritional Supplements (NUTRITIONAL SUPPLEMENT PO) Take by mouth. Stress Relief Complex-One tablet daily     Nutritional Supplements (NUTRITIONAL SUPPLEMENT PO) Shaklee Collagen 9-2 scoops daily     Nutritional Supplements (NUTRITIONAL SUPPLEMENT  PO) Take by mouth. Shaklee Cholesterol Reduction - 2 tablets by mouth twice a day     Nutritional Supplements (NUTRITIONAL SUPPLEMENT PO) Take by mouth. Shaklee Co Q Heart-1 capsule by mouth daily     Nutritional Supplements (SOY PROTEIN SHAKE) POWD Take by mouth.     Omega-3 Fatty Acids (FISH OIL) 1000 MG CAPS Take by mouth.     Propylene Glycol (SYSTANE BALANCE OP) Apply to eye.     TURMERIC PO Take by mouth.     UNABLE TO FIND Med Name: CarotoMax and Eye Taking 1 capsule by mouth after breakfast daily     UNABLE TO FIND Herb-Lax Taking 1 tablet by mouth after breakfast daily     UNABLE TO FIND Med Name: Mind Work     VITAMIN E PO Take by mouth.     ZINC OXIDE PO Take by mouth.     Zinc Sulfate (ZINC 15 PO) Take by mouth.     No current facility-administered medications on file prior to visit.    BP 128/82   Pulse 88   Temp 98.6 F (37 C) (Temporal)   Ht 5' (1.524 m)   Wt 130 lb (59 kg)   SpO2 98%   BMI 25.39 kg/m  Objective:   Physical Exam Constitutional:      General: She is not in acute distress. Pulmonary:     Effort: Pulmonary effort is normal.  Abdominal:     Tenderness: There is no right CVA tenderness or left CVA tenderness.  Skin:    General: Skin is warm and dry.           Assessment & Plan:  Urinary frequency Assessment & Plan: Differentials include acute cystitis, overactive bladder  POC urine dipstick negative so suspect overactive bladder.   Discussed with patient about toileting schedule and limiting the caffeine intake. Discussed doing Kegal exercises.   Pelvic floor PT offered and she will let us know.   I evaluated patient, was consulted regarding treatment, and agree with assessment and plan per Tinnie Gens, RN, DNP student.   Allie Bossier, NP-C   Orders: -     POCT Urinalysis Dipstick (Automated)  Urinary urgency -     POCT Urinalysis Dipstick (Automated)  Mixed stress and urge urinary incontinence Assessment & Plan: Discussed  conservative treatment today including Kegel exercises, pelvic floor PT, timed toileting, etc. She declines pelvic floor PT. She will update if symptoms worsen.         Pleas Koch, NP

## 2022-06-20 NOTE — Patient Instructions (Addendum)
Limit caffeine intake and avoid drinking it after 12 pm if possible. Try the toileting schedule such as setting an alarm.   Discussed doing kegel exercises. Let us know if you would like the referral for Pelvic Floor PT.   It was a pleasure to see you today!

## 2022-06-20 NOTE — Assessment & Plan Note (Addendum)
Differentials include acute cystitis, overactive bladder  POC urine dipstick negative so suspect overactive bladder.   Discussed with patient about toileting schedule and limiting the caffeine intake. Discussed doing Kegal exercises.   Pelvic floor PT offered and she will let us know.   I evaluated patient, was consulted regarding treatment, and agree with assessment and plan per Tinnie Gens, RN, DNP student.   Allie Bossier, NP-C

## 2022-06-20 NOTE — Progress Notes (Signed)
Established Patient Office Visit  Subjective   Patient ID: Tanicka Bisaillon, female    DOB: 04-10-1956  Age: 67 y.o. MRN: [id number]  Chief Complaint  Patient presents with   Urinary Frequency    Patient states she has increased her water intake recently  She has had episodes of incontinence a lot in the last couple months    Urinary Frequency  Associated symptoms include frequency and urgency. Pertinent negatives include no chills, flank pain or hematuria.    Pallavi Clifton is a 67 year old female with past medical history of GERD, HLD, vertigo who presents today for an acute visit.   She has been increasing urinary frequency that started three months ago. She has been drinking 60-70 oz daily of water daily along with two cups of coffee in the morning and tea throughout the day. She denies any painful urination, burning or lower abdominal pain. Denies any fever, chills, nausea, vomiting. She has been experiencing some incontinence with coughing, sneezing, waiting too long to go to the bathroom or laughing too hard.    Patient Active Problem List   Diagnosis Date Noted   Urinary frequency 06/20/2022   Urinary urgency 06/20/2022   Acute shoulder pain 11/03/2021   Palpitations 08/16/2021   Other fatigue 07/05/2021   Rash and nonspecific skin eruption 05/02/2021   Elevated blood pressure reading in office without diagnosis of hypertension 05/02/2021   Frequent headaches 10/21/2020   Joint stiffness 03/03/2020   Genital herpes 03/03/2020   Preventative health care 07/17/2019   Hyperlipidemia 07/17/2019   GERD (gastroesophageal reflux disease) 07/17/2019   Vertigo 08/27/2018   Knee pain 05/03/2017   Past Medical History:  Diagnosis Date   Atypical mole 09/10/2019   L ant thigh near the knee    Migraines    UTI (urinary tract infection)    Past Surgical History:  Procedure Laterality Date   TUBAL LIGATION  1984   Family History  Problem Relation Age of Onset   Arthritis  Mother    Alzheimer's disease Mother    Alcohol abuse Father    Asthma Sister    Asthma Brother    Allergies  Allergen Reactions   Codeine     Other reaction(s): Agitation   Sulfa Antibiotics Hives      Review of Systems  Constitutional:  Negative for chills and fever.  Respiratory:  Negative for shortness of breath.   Cardiovascular:  Negative for chest pain.  Gastrointestinal:  Negative for constipation and diarrhea.  Genitourinary:  Positive for frequency and urgency. Negative for dysuria, flank pain and hematuria.  Neurological:  Negative for dizziness and headaches.      Objective:     BP 128/82   Pulse 88   Temp 98.6 F (37 C) (Temporal)   Ht 5' (1.524 m)   Wt 130 lb (59 kg)   SpO2 98%   BMI 25.39 kg/m  BP Readings from Last 3 Encounters:  06/20/22 128/82  05/09/22 114/74  11/29/21 128/80   Wt Readings from Last 3 Encounters:  06/20/22 130 lb (59 kg)  05/09/22 130 lb 2 oz (59 kg)  12/21/21 135 lb (61.2 kg)      Physical Exam Vitals and nursing note reviewed.  Constitutional:      Appearance: Normal appearance.  Cardiovascular:     Rate and Rhythm: Normal rate and regular rhythm.     Pulses: Normal pulses.     Heart sounds: Normal heart sounds.  Pulmonary:  Effort: Pulmonary effort is normal.     Breath sounds: Normal breath sounds.  Abdominal:     General: Bowel sounds are normal.  Neurological:     Mental Status: She is alert and oriented to person, place, and time.  Psychiatric:        Mood and Affect: Mood normal.        Behavior: Behavior normal.      Results for orders placed or performed in visit on 06/20/22  POCT Urinalysis Dipstick (Automated)  Result Value Ref Range   Color, UA yelow    Clarity, UA clear    Glucose, UA Negative Negative   Bilirubin, UA neg    Ketones, UA neg    Spec Grav, UA <=1.005 (A) 1.010 - 1.025   Blood, UA neg    pH, UA 6.0 5.0 - 8.0   Protein, UA Negative Negative   Urobilinogen, UA 0.2 0.2 or  1.0 E.U./dL   Nitrite, UA neg    Leukocytes, UA Negative Negative       The 10-year ASCVD risk score (Arnett DK, et al., 2019) is: 7%    Assessment & Plan:   Problem List Items Addressed This Visit       Other   Urinary frequency - Primary    Differentials include acute cystitis, overactive bladder  POC urine dipstick negative.   Discussed with patient about toileting schedule and limiting the caffeine intake. Discussed doing Kegal exercises.   Pelvic floor PT offered and she will let us know.       Relevant Orders   POCT Urinalysis Dipstick (Automated) (Completed)   Urinary urgency   Relevant Orders   POCT Urinalysis Dipstick (Automated) (Completed)    No follow-ups on file.    Tinnie Gens, BSN-RN, DNP STUDENT

## 2022-06-20 NOTE — Assessment & Plan Note (Signed)
Discussed conservative treatment today including Kegel exercises, pelvic floor PT, timed toileting, etc. She declines pelvic floor PT. She will update if symptoms worsen.

## 2022-07-06 ENCOUNTER — Encounter: Payer: Self-pay | Admitting: Family Medicine

## 2022-07-15 NOTE — Progress Notes (Unsigned)
    Kelly James T. Kelly Azucena, MD, [place] at [facility] [place], [address]  Phone: [phone]  FAX: [phone]  Kelly James - 67 y.o. female  MRN [id number]  Date of Birth: 03/31/1956  Date: 07/16/2022  PCP: Pleas Koch, NP  Referral: Pleas Koch, NP  No chief complaint on file.  Subjective:   Kelly James is a 67 y.o. very pleasant female patient with There is no height or weight on file to calculate BMI. who presents with the following:  She is here to follow-up about some right-sided arm pain.  I did see her in early December, and at that time she was basically asymptomatic in the office.  She had historically been having some anterior arm pain.  That time it seemed relatively calm, and I recommended some ibuprofen before working out.    Review of Systems is noted in the HPI, as appropriate  Objective:   There were no vitals taken for this visit.  GEN: No acute distress; alert,appropriate. PULM: Breathing comfortably in no respiratory distress PSYCH: Normally interactive.   Laboratory and Imaging Data:  Assessment and Plan:   ***

## 2022-07-16 ENCOUNTER — Encounter: Payer: Self-pay | Admitting: Family Medicine

## 2022-07-16 ENCOUNTER — Ambulatory Visit (INDEPENDENT_AMBULATORY_CARE_PROVIDER_SITE_OTHER): Payer: Medicare Other | Admitting: Family Medicine

## 2022-07-16 VITALS — BP 120/72 | HR 87 | Temp 98.3°F | Ht 60.0 in | Wt 133.1 lb

## 2022-07-16 DIAGNOSIS — M79601 Pain in right arm: Secondary | ICD-10-CM | POA: Diagnosis not present

## 2022-07-16 DIAGNOSIS — M7521 Bicipital tendinitis, right shoulder: Secondary | ICD-10-CM

## 2022-07-16 MED ORDER — PREDNISONE 20 MG PO TABS: ORAL_TABLET | ORAL | 0 refills | Status: DC

## 2022-07-17 ENCOUNTER — Encounter: Payer: Self-pay | Admitting: Family Medicine

## 2022-08-14 DIAGNOSIS — H40053 Ocular hypertension, bilateral: Secondary | ICD-10-CM | POA: Diagnosis not present

## 2022-09-06 ENCOUNTER — Encounter: Payer: Self-pay | Admitting: Family Medicine

## 2022-09-07 MED ORDER — PREDNISONE 20 MG PO TABS: ORAL_TABLET | ORAL | 0 refills | Status: DC

## 2022-10-15 DIAGNOSIS — Z01419 Encounter for gynecological examination (general) (routine) without abnormal findings: Secondary | ICD-10-CM | POA: Diagnosis not present

## 2022-10-15 DIAGNOSIS — Z1231 Encounter for screening mammogram for malignant neoplasm of breast: Secondary | ICD-10-CM | POA: Diagnosis not present

## 2022-10-15 DIAGNOSIS — M810 Age-related osteoporosis without current pathological fracture: Secondary | ICD-10-CM | POA: Diagnosis not present

## 2022-10-15 DIAGNOSIS — Z6825 Body mass index (BMI) 25.0-25.9, adult: Secondary | ICD-10-CM | POA: Diagnosis not present

## 2022-11-21 ENCOUNTER — Ambulatory Visit (INDEPENDENT_AMBULATORY_CARE_PROVIDER_SITE_OTHER): Payer: Medicare Other | Admitting: Primary Care

## 2022-11-21 ENCOUNTER — Ambulatory Visit (INDEPENDENT_AMBULATORY_CARE_PROVIDER_SITE_OTHER)
Admission: RE | Admit: 2022-11-21 | Discharge: 2022-11-21 | Disposition: A | Discharge status: Home / Self Care | Payer: Medicare Other | Source: Ambulatory Visit | Attending: Primary Care | Admitting: Primary Care

## 2022-11-21 ENCOUNTER — Encounter: Payer: Self-pay | Admitting: Primary Care

## 2022-11-21 VITALS — BP 126/82 | HR 88 | Temp 98.1°F | Ht 60.0 in | Wt 133.0 lb

## 2022-11-21 DIAGNOSIS — R2242 Localized swelling, mass and lump, left lower limb: Secondary | ICD-10-CM | POA: Diagnosis not present

## 2022-11-21 DIAGNOSIS — M79672 Pain in left foot: Secondary | ICD-10-CM | POA: Diagnosis not present

## 2022-11-21 NOTE — Assessment & Plan Note (Signed)
Unclear etiology. Almost representative of a cyst.   Checking plain films of the left foot today. Referral placed for podiatry evaluation.

## 2022-11-21 NOTE — Patient Instructions (Signed)
Complete xray(s) prior to leaving today. I will notify you of your results once received.  You will either be contacted via phone regarding your referral to podiatry, or you may receive a letter on your MyChart portal from our referral team with instructions for scheduling an appointment. Please let us know if you have not been contacted by anyone within two weeks.  It was a pleasure to see you today!  

## 2022-11-21 NOTE — Progress Notes (Signed)
Subjective:    Patient ID: Kelly James, female    DOB: February 28, 1956, 67 y.o.   MRN: [id number]  HPI  Kelly James is a very pleasant 67 y.o. female with a history of joint stiffness, shoulder pain, fatigue, knee pain who presents today to discuss foot mass.  The mass is located to the left plantar foot at the arch.  She initially noticed pain to the left plantar foot at the arch about 2 weeks ago. About 5 days ago she rubbed her foot and noticed a "knot" to the site of her pain. Her pain is only bothersome when walking.   She changed her shoes thinking that her pain and mass was from her shoes. She also increased her size in the shoe. This has helped some but she continues to notice the pain.   She denies injury/trauma, pain elsewhere to her foot, color changes, joint swelling to the foot. She's not taken or applied anything for her foot pain.   BP Readings from Last 3 Encounters:  11/21/22 126/82  07/16/22 120/72  06/20/22 128/82      Review of Systems  Musculoskeletal:  Positive for arthralgias. Negative for joint swelling.  Skin:  Negative for color change.  Neurological:  Negative for numbness.         Past Medical History:  Diagnosis Date   Atypical mole 09/10/2019   L ant thigh near the knee    Migraines    UTI (urinary tract infection)     Social History   Socioeconomic History   Marital status: Married    Spouse name: Not on file   Number of children: Not on file   Years of education: Not on file   Highest education level: Not on file  Occupational History   Not on file  Tobacco Use   Smoking status: Never   Smokeless tobacco: Never  Vaping Use   Vaping Use: Never used  Substance and Sexual Activity   Alcohol use: No   Drug use: No   Sexual activity: Not on file  Other Topics Concern   Not on file  Social History Narrative   Married.   2 children, 2 grandchildren.   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   Enjoys baking.   Social Determinants  of Health   Financial Resource Strain: Low Risk  (12/21/2021)   Overall Financial Resource Strain (CARDIA)    Difficulty of Paying Living Expenses: Not hard at all  Food Insecurity: No Food Insecurity (12/21/2021)   Hunger Vital Sign    Worried About Running Out of Food in the Last Year: Never true    Ran Out of Food in the Last Year: Never true  Transportation Needs: No Transportation Needs (12/21/2021)   PRAPARE - Administrator, Civil Service (Medical): No    Lack of Transportation (Non-Medical): No  Physical Activity: Insufficiently Active (12/21/2021)   Exercise Vital Sign    Days of Exercise per Week: 4 days    Minutes of Exercise per Session: 30 min  Stress: No Stress Concern Present (12/21/2021)   [employer] of Occupational Health - Occupational Stress Questionnaire    Feeling of Stress : Not at all  Social Connections: Not on file  Intimate Partner Violence: Not on file    Past Surgical History:  Procedure Laterality Date   TUBAL LIGATION  1984    Family History  Problem Relation Age of Onset   Arthritis Mother    Alzheimer's disease Mother  Alcohol abuse Father    Asthma Sister    Asthma Brother     Allergies  Allergen Reactions   Codeine     Other reaction(s): Agitation   Sulfa Antibiotics Hives    Current Outpatient Medications on File Prior to Visit  Medication Sig Dispense Refill   acyclovir (ZOVIRAX) 400 MG tablet Take 400 mg by mouth 2 (two) times daily.     ALFALFA PO Take by mouth.     Ascorbic Acid (CHEWABLE VITAMIN C PO) Take 100 mg by mouth.     Ascorbic Acid (VITA-C PO)      B Complex-Biotin-FA (B COMPLETE PO) Take by mouth.     CALCIUM PO Take 1,560 mg by mouth daily.     Cholecalciferol (D3-1000 PO) Take 1 tablet by mouth daily. Vitamin D     Misc Natural Products (JOINT SUPPORT COMPLEX PO) Take by mouth.     Nutritional Supplements (NUTRITIONAL SUPPLEMENT PO) Take by mouth. Stress Relief Complex-One tablet daily      Nutritional Supplements (NUTRITIONAL SUPPLEMENT PO) Shaklee Collagen 9-2 scoops daily     Nutritional Supplements (NUTRITIONAL SUPPLEMENT PO) Take by mouth. Shaklee Cholesterol Reduction - 2 tablets by mouth twice a day     Nutritional Supplements (NUTRITIONAL SUPPLEMENT PO) Take by mouth. Shaklee Co Q Heart-1 capsule by mouth daily     Nutritional Supplements (SOY PROTEIN SHAKE) POWD Take by mouth.     Omega-3 Fatty Acids (FISH OIL) 1000 MG CAPS Take by mouth.     Propylene Glycol (SYSTANE BALANCE OP) Apply to eye.     TURMERIC PO Take by mouth.     UNABLE TO FIND Med Name: CarotoMax and Eye Taking 1 capsule by mouth after breakfast daily     UNABLE TO FIND Herb-Lax Taking 1 tablet by mouth after breakfast daily     UNABLE TO FIND Med Name: Mind Work     VITAMIN E PO Take by mouth.     Zinc Sulfate (ZINC 15 PO) Take by mouth.     predniSONE (DELTASONE) 20 MG tablet 2 tabs po daily for 5 days, then 1 tab po daily for 5 days 15 tablet 0   No current facility-administered medications on file prior to visit.    BP 126/82   Pulse 88   Temp 98.1 F (36.7 C) (Temporal)   Ht 5' (1.524 m)   Wt 133 lb (60.3 kg)   SpO2 98%   BMI 25.97 kg/m  Objective:   Physical Exam Constitutional:      General: She is not in acute distress. Musculoskeletal:       Feet:  Feet:     Comments: 1.5-2 cm rounded, semi-soft, deep tissue mass to mid left plantar foot. No erythema. No masses to right plantar foot. Skin:    General: Skin is warm and dry.           Assessment & Plan:  Foot mass, left Assessment & Plan: Unclear etiology. Almost representative of a cyst.   Checking plain films of the left foot today. Referral placed for podiatry evaluation.   Orders: -     Ambulatory referral to Podiatry -     DG Foot Complete Left        Doreene Nest, NP

## 2022-12-10 ENCOUNTER — Ambulatory Visit (INDEPENDENT_AMBULATORY_CARE_PROVIDER_SITE_OTHER): Payer: Medicare Other | Admitting: Podiatry

## 2022-12-10 ENCOUNTER — Ambulatory Visit: Payer: Medicare Other

## 2022-12-10 DIAGNOSIS — M79672 Pain in left foot: Secondary | ICD-10-CM

## 2022-12-10 DIAGNOSIS — M722 Plantar fascial fibromatosis: Secondary | ICD-10-CM | POA: Diagnosis not present

## 2022-12-16 NOTE — Progress Notes (Signed)
   Chief Complaint  Patient presents with   Foot Pain    Patient came in today for left foot soft tissue mass, started a month ago, no pain,  arch,     HPI: 67 y.o. female presenting today as a new patient referral from her PCP for evaluation of a soft tissue mass that developed to the left foot about 1 month ago.  There is no pain.  Denies a history of injury.  Has not anything for treatment.  Past Medical History:  Diagnosis Date   Atypical mole 09/10/2019   L ant thigh near the knee    Migraines    UTI (urinary tract infection)     Past Surgical History:  Procedure Laterality Date   TUBAL LIGATION  1984    Allergies  Allergen Reactions   Codeine     Other reaction(s): Agitation   Sulfa Antibiotics Hives     Physical Exam: General: The patient is alert and oriented x3 in no acute distress.  Dermatology: Skin is warm, dry and supple bilateral lower extremities.   Vascular: Palpable pedal pulses bilaterally. Capillary refill within normal limits.  No appreciable edema.  No erythema.  Neurological: Grossly intact via light touch  Musculoskeletal Exam: No pedal deformities noted.  None adhered nontender mass noted to the plantar aspect of the left foot likely consistent with plantar fibroma  Assessment/Plan of Care: 1.  Plantar fibroma left foot -Recommend conservative treatment.  Recommend good supportive shoes that do not irritate the lesion -With the mass ever becomes symptomatic recommend follow-up in the office -Return to clinic as needed      Felecia Shelling, DPM [facility]  Dr. Felecia Shelling, DPM    [address] [place], [place] [address]                Office [phone]  Fax [phone]

## 2022-12-25 ENCOUNTER — Ambulatory Visit (INDEPENDENT_AMBULATORY_CARE_PROVIDER_SITE_OTHER): Payer: Medicare Other

## 2022-12-25 VITALS — Wt 133.0 lb

## 2022-12-25 DIAGNOSIS — Z0000 Encounter for general adult medical examination without abnormal findings: Secondary | ICD-10-CM

## 2022-12-25 NOTE — Patient Instructions (Signed)
Kelly James , Thank you for taking time to come for your Medicare Wellness Visit. I appreciate your ongoing commitment to your health goals. Please review the following plan we discussed and let me know if I can assist you in the future.   These are the goals we discussed:  Goals       Patient Stated      12/21/2021, wants shoulder to stop hurting      Patient Stated (pt-stated)      Get to work out as much as can         This is a list of the screening recommended for you and due dates:  Health Maintenance  Topic Date Due   COVID-19 Vaccine (5 - 2023-24 season) 02/02/2022   Flu Shot  01/03/2023   Mammogram  10/12/2023   Medicare Annual Wellness Visit  12/25/2023   DEXA scan (bone density measurement)  10/11/2024   Colon Cancer Screening  07/08/2028   DTaP/Tdap/Td vaccine (2 - Td or Tdap) 07/16/2029   Pneumonia Vaccine  Completed   Hepatitis C Screening  Completed   Zoster (Shingles) Vaccine  Completed   HPV Vaccine  Aged Out    Advanced directives: Please bring a copy of your health care power of attorney and living will to the office at your convenience.  Conditions/risks identified: get back to working out   Next appointment: Follow up in one year for your annual wellness visit    Preventive Care 65 Years and Older, Female Preventive care refers to lifestyle choices and visits with your health care provider that can promote health and wellness. What does preventive care include? A yearly physical exam. This is also called an annual well check. Dental exams once or twice a year. Routine eye exams. Ask your health care provider how often you should have your eyes checked. Personal lifestyle choices, including: Daily care of your teeth and gums. Regular physical activity. Eating a healthy diet. Avoiding tobacco and drug use. Limiting alcohol use. Practicing safe sex. Taking low-dose aspirin every day. Taking vitamin and mineral supplements as recommended by your health  care provider. What happens during an annual well check? The services and screenings done by your health care provider during your annual well check will depend on your age, overall health, lifestyle risk factors, and family history of disease. Counseling  Your health care provider may ask you questions about your: Alcohol use. Tobacco use. Drug use. Emotional well-being. Home and relationship well-being. Sexual activity. Eating habits. History of falls. Memory and ability to understand (cognition). Work and work Astronomer. Reproductive health. Screening  You may have the following tests or measurements: Height, weight, and BMI. Blood pressure. Lipid and cholesterol levels. These may be checked every 5 years, or more frequently if you are over 11 years old. Skin check. Lung cancer screening. You may have this screening every year starting at age 65 if you have a 30-pack-year history of smoking and currently smoke or have quit within the past 15 years. Fecal occult blood test (FOBT) of the stool. You may have this test every year starting at age 76. Flexible sigmoidoscopy or colonoscopy. You may have a sigmoidoscopy every 5 years or a colonoscopy every 10 years starting at age 55. Hepatitis C blood test. Hepatitis B blood test. Sexually transmitted disease (STD) testing. Diabetes screening. This is done by checking your blood sugar (glucose) after you have not eaten for a while (fasting). You may have this done every 1-3 years. Bone  density scan. This is done to screen for osteoporosis. You may have this done starting at age 66. Mammogram. This may be done every 1-2 years. Talk to your health care provider about how often you should have regular mammograms. Talk with your health care provider about your test results, treatment options, and if necessary, the need for more tests. Vaccines  Your health care provider may recommend certain vaccines, such as: Influenza vaccine. This is  recommended every year. Tetanus, diphtheria, and acellular pertussis (Tdap, Td) vaccine. You may need a Td booster every 10 years. Zoster vaccine. You may need this after age 40. Pneumococcal 13-valent conjugate (PCV13) vaccine. One dose is recommended after age 28. Pneumococcal polysaccharide (PPSV23) vaccine. One dose is recommended after age 52. Talk to your health care provider about which screenings and vaccines you need and how often you need them. This information is not intended to replace advice given to you by your health care provider. Make sure you discuss any questions you have with your health care provider. Document Released: 06/17/2015 Document Revised: 02/08/2016 Document Reviewed: 03/22/2015 Elsevier Interactive Patient Education  2017 [employer].  Fall Prevention in the Home Falls can cause injuries. They can happen to people of all ages. There are many things you can do to make your home safe and to help prevent falls. What can I do on the outside of my home? Regularly fix the edges of walkways and driveways and fix any cracks. Remove anything that might make you trip as you walk through a door, such as a raised step or threshold. Trim any bushes or trees on the path to your home. Use bright outdoor lighting. Clear any walking paths of anything that might make someone trip, such as rocks or tools. Regularly check to see if handrails are loose or broken. Make sure that both sides of any steps have handrails. Any raised decks and porches should have guardrails on the edges. Have any leaves, snow, or ice cleared regularly. Use sand or salt on walking paths during winter. Clean up any spills in your garage right away. This includes oil or grease spills. What can I do in the bathroom? Use night lights. Install grab bars by the toilet and in the tub and shower. Do not use towel bars as grab bars. Use non-skid mats or decals in the tub or shower. If you need to sit down in  the shower, use a plastic, non-slip stool. Keep the floor dry. Clean up any water that spills on the floor as soon as it happens. Remove soap buildup in the tub or shower regularly. Attach bath mats securely with double-sided non-slip rug tape. Do not have throw rugs and other things on the floor that can make you trip. What can I do in the bedroom? Use night lights. Make sure that you have a light by your bed that is easy to reach. Do not use any sheets or blankets that are too big for your bed. They should not hang down onto the floor. Have a firm chair that has side arms. You can use this for support while you get dressed. Do not have throw rugs and other things on the floor that can make you trip. What can I do in the kitchen? Clean up any spills right away. Avoid walking on wet floors. Keep items that you use a lot in easy-to-reach places. If you need to reach something above you, use a strong step stool that has a grab bar. Keep  electrical cords out of the way. Do not use floor polish or wax that makes floors slippery. If you must use wax, use non-skid floor wax. Do not have throw rugs and other things on the floor that can make you trip. What can I do with my stairs? Do not leave any items on the stairs. Make sure that there are handrails on both sides of the stairs and use them. Fix handrails that are broken or loose. Make sure that handrails are as long as the stairways. Check any carpeting to make sure that it is firmly attached to the stairs. Fix any carpet that is loose or worn. Avoid having throw rugs at the top or bottom of the stairs. If you do have throw rugs, attach them to the floor with carpet tape. Make sure that you have a light switch at the top of the stairs and the bottom of the stairs. If you do not have them, ask someone to add them for you. What else can I do to help prevent falls? Wear shoes that: Do not have high heels. Have rubber bottoms. Are comfortable  and fit you well. Are closed at the toe. Do not wear sandals. If you use a stepladder: Make sure that it is fully opened. Do not climb a closed stepladder. Make sure that both sides of the stepladder are locked into place. Ask someone to hold it for you, if possible. Clearly mark and make sure that you can see: Any grab bars or handrails. First and last steps. Where the edge of each step is. Use tools that help you move around (mobility aids) if they are needed. These include: Canes. Walkers. Scooters. Crutches. Turn on the lights when you go into a dark area. Replace any light bulbs as soon as they burn out. Set up your furniture so you have a clear path. Avoid moving your furniture around. If any of your floors are uneven, fix them. If there are any pets around you, be aware of where they are. Review your medicines with your doctor. Some medicines can make you feel dizzy. This can increase your chance of falling. Ask your doctor what other things that you can do to help prevent falls. This information is not intended to replace advice given to you by your health care provider. Make sure you discuss any questions you have with your health care provider. Document Released: 03/17/2009 Document Revised: 10/27/2015 Document Reviewed: 06/25/2014 Elsevier Interactive Patient Education  2017 [employer].

## 2022-12-25 NOTE — Progress Notes (Signed)
Subjective:   Kelly James is a 67 y.o. female who presents for Medicare Annual (Subsequent) preventive examination.   Per patient no change in vitals since last visit; unable to obtain new vitals due to this being a telehealth visit. 12/25/22 Patient was unable to self-report vital signs via telehealth due to a lack of equipment at home.   Visit Complete: Virtual  I connected with  Kelly James on 12/25/22 by a audio enabled telemedicine application and verified that I am speaking with the correct person using two identifiers.  Patient Location: Home  Provider Location: Office/Clinic  I discussed the limitations of evaluation and management by telemedicine. The patient expressed understanding and agreed to proceed.  Patient Medicare AWV questionnaire was completed by the patient on 12/24/22; I have confirmed that all information answered by patient is correct and no changes since this date.  Review of Systems     Cardiac Risk Factors include: advanced age (>39men, >63 women);dyslipidemia     Objective:    Today's Vitals   12/25/22 0901  Weight: 133 lb (60.3 kg)   Body mass index is 25.97 kg/m.     12/25/2022    9:09 AM 01/29/2022    2:23 PM 12/21/2021   12:21 PM  Advanced Directives  Does Patient Have a Medical Advance Directive? Yes Yes Yes  Type of Estate agent of [place];Living will Healthcare Power of [employer] of [place];Living will  Does patient want to make changes to medical advance directive?  No - Patient declined   Copy of Healthcare Power of Attorney in Chart? No - copy requested  No - copy requested    Current Medications (verified) Outpatient Encounter Medications as of 12/25/2022  Medication Sig   acyclovir (ZOVIRAX) 400 MG tablet Take 400 mg by mouth 2 (two) times daily.   ALFALFA PO Take by mouth.   Ascorbic Acid (CHEWABLE VITAMIN C PO) Take 100 mg by mouth.   Ascorbic Acid (VITA-C PO)    B Complex-Biotin-FA  (B COMPLETE PO) Take by mouth.   CALCIUM PO Take 1,560 mg by mouth daily.   Cholecalciferol (D3-1000 PO) Take 1 tablet by mouth daily. Vitamin D   Misc Natural Products (JOINT SUPPORT COMPLEX PO) Take by mouth.   Nutritional Supplements (NUTRITIONAL SUPPLEMENT PO) Take by mouth. Stress Relief Complex-One tablet daily   Nutritional Supplements (NUTRITIONAL SUPPLEMENT PO) Shaklee Collagen 9-2 scoops daily   Nutritional Supplements (NUTRITIONAL SUPPLEMENT PO) Take by mouth. Shaklee Cholesterol Reduction - 2 tablets by mouth twice a day   Nutritional Supplements (NUTRITIONAL SUPPLEMENT PO) Take by mouth. Shaklee Co Q Heart-1 capsule by mouth daily   Nutritional Supplements (SOY PROTEIN SHAKE) POWD Take by mouth.   Omega-3 Fatty Acids (FISH OIL) 1000 MG CAPS Take by mouth.   Propylene Glycol (SYSTANE BALANCE OP) Apply to eye.   TART CHERRY PO Take by mouth.   TURMERIC PO Take by mouth.   UNABLE TO FIND Med Name: CarotoMax and Eye Taking 1 capsule by mouth after breakfast daily   UNABLE TO FIND Herb-Lax Taking 1 tablet by mouth after breakfast daily   UNABLE TO FIND Med Name: Mind Work   VITAMIN E PO Take by mouth.   Zinc Sulfate (ZINC 15 PO) Take by mouth.   [DISCONTINUED] predniSONE (DELTASONE) 20 MG tablet 2 tabs po daily for 5 days, then 1 tab po daily for 5 days   No facility-administered encounter medications on file as of 12/25/2022.    Allergies (verified)  Codeine and Sulfa antibiotics   History: Past Medical History:  Diagnosis Date   Atypical mole 09/10/2019   L ant thigh near the knee    Migraines    UTI (urinary tract infection)    Past Surgical History:  Procedure Laterality Date   TUBAL LIGATION  1984   Family History  Problem Relation Age of Onset   Arthritis Mother    Alzheimer's disease Mother    Alcohol abuse Father    Asthma Sister    Asthma Brother    Social History   Socioeconomic History   Marital status: Married    Spouse name: Not on file   Number  of children: Not on file   Years of education: Not on file   Highest education level: Not on file  Occupational History   Not on file  Tobacco Use   Smoking status: Never   Smokeless tobacco: Never  Vaping Use   Vaping status: Never Used  Substance and Sexual Activity   Alcohol use: No   Drug use: No   Sexual activity: Not on file  Other Topics Concern   Not on file  Social History Narrative   Married.   2 children, 2 grandchildren.   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   Enjoys baking.   Social Determinants of Health   Financial Resource Strain: Low Risk  (12/24/2022)   Overall Financial Resource Strain (CARDIA)    Difficulty of Paying Living Expenses: Not hard at all  Food Insecurity: No Food Insecurity (12/24/2022)   Hunger Vital Sign    Worried About Running Out of Food in the Last Year: Never true    Ran Out of Food in the Last Year: Never true  Transportation Needs: No Transportation Needs (12/24/2022)   PRAPARE - Administrator, Civil Service (Medical): No    Lack of Transportation (Non-Medical): No  Physical Activity: Insufficiently Active (12/24/2022)   Exercise Vital Sign    Days of Exercise per Week: 3 days    Minutes of Exercise per Session: 30 min  Stress: No Stress Concern Present (12/24/2022)   [employer] of Occupational Health - Occupational Stress Questionnaire    Feeling of Stress : Not at all  Social Connections: Moderately Isolated (12/24/2022)   Social Connection and Isolation Panel [NHANES]    Frequency of Communication with Friends and Family: More than three times a week    Frequency of Social Gatherings with Friends and Family: Patient declined    Attends Religious Services: Never    Database administrator or Organizations: No    Attends Engineer, structural: Never    Marital Status: Married    Tobacco Counseling Counseling given: Not Answered   Clinical Intake:  Pre-visit preparation completed: Yes  Pain :  No/denies pain     BMI - recorded: 25.97 Nutritional Status: BMI 25 -29 Overweight Nutritional Risks: None Diabetes: No  How often do you need to have someone help you when you read instructions, pamphlets, or other written materials from your doctor or pharmacy?: 1 - Never  Interpreter Needed?: No  Information entered by :: Lanier Ensign, LPN   Activities of Daily Living    12/24/2022    9:23 AM  In your present state of health, do you have any difficulty performing the following activities:  Hearing? 0  Vision? 0  Difficulty concentrating or making decisions? 0  Walking or climbing stairs? 0  Dressing or bathing? 0  Doing errands,  shopping? 0  Preparing Food and eating ? N  Using the Toilet? N  In the past six months, have you accidently leaked urine? Y   Y  Comment at times  Do you have problems with loss of bowel control? N  Managing your Medications? N  Managing your Finances? N  Housekeeping or managing your Housekeeping? N    Patient Care Team: Doreene Nest, NP as PCP - General (Internal Medicine)  Indicate any recent Medical Services you may have received from other than [facility] providers in the past year (date may be approximate).     Assessment:   This is a routine wellness examination for Kelly James.  Hearing/Vision screen Hearing Screening - Comments:: Pt denies any hearing issues  Vision Screening - Comments:: Pt follows up with Dr Fredrich Birks for annual eye exams   Dietary issues and exercise activities discussed:     Goals Addressed               This Visit's Progress     Patient Stated (pt-stated)        Get to work out as much as can        Depression Screen    12/25/2022    9:07 AM 12/21/2021   12:22 PM 08/16/2021    2:21 PM 05/02/2021   10:07 AM 03/03/2020   11:38 AM  PHQ 2/9 Scores  PHQ - 2 Score 0 0 0 0 0  PHQ- 9 Score    0 0    Fall Risk    12/24/2022    9:23 AM 11/21/2022    3:25 PM 07/16/2022    2:33 PM 12/21/2021    12:22 PM 08/16/2021    2:21 PM  Fall Risk   Falls in the past year? 0 0 0 0 0  Number falls in past yr:  0 0 0 0  Injury with Fall?  0 0 0 0  Risk for fall due to : Impaired vision No Fall Risks No Fall Risks No Fall Risks   Follow up Falls prevention discussed Falls evaluation completed Falls evaluation completed Falls evaluation completed;Education provided;Falls prevention discussed Falls evaluation completed    MEDICARE RISK AT HOME:   TIMED UP AND GO:  Was the test performed?  No    Cognitive Function:        12/25/2022    9:10 AM 12/21/2021   12:25 PM  6CIT Screen  What Year? 0 points 0 points  What month? 0 points 0 points  What time? 0 points 0 points  Count back from 20 0 points 0 points  Months in reverse 0 points 0 points  Repeat phrase 0 points 0 points  Total Score 0 points 0 points    Immunizations Immunization History  Administered Date(s) Administered   Influenza,inj,Quad PF,6+ Mos 07/17/2019   Influenza-Unspecified 03/22/2021, 02/15/2022   PFIZER(Purple Top)SARS-COV-2 Vaccination 10/15/2019, 11/05/2019, 06/09/2020, 03/22/2021   PNEUMOCOCCAL CONJUGATE-20 01/04/2022   Tdap 07/17/2019   Zoster Recombinant(Shingrix) 08/04/2019, 12/30/2019    TDAP status: Up to date  Flu Vaccine status: Up to date  Pneumococcal vaccine status: Up to date  Covid-19 vaccine status: Completed vaccines  Qualifies for Shingles Vaccine? Yes   Zostavax completed Yes   Shingrix Completed?: Yes  Screening Tests Health Maintenance  Topic Date Due   COVID-19 Vaccine (5 - 2023-24 season) 02/02/2022   INFLUENZA VACCINE  01/03/2023   MAMMOGRAM  10/12/2023   Medicare Annual Wellness (AWV)  12/25/2023   DEXA SCAN  10/11/2024   Colonoscopy  07/08/2028   DTaP/Tdap/Td (2 - Td or Tdap) 07/16/2029   Pneumonia Vaccine 31+ Years old  Completed   Hepatitis C Screening  Completed   Zoster Vaccines- Shingrix  Completed   HPV VACCINES  Aged Out    Health Maintenance  Health  Maintenance Due  Topic Date Due   COVID-19 Vaccine (5 - 2023-24 season) 02/02/2022    Colorectal cancer screening: Type of screening: Colonoscopy. Completed 07/08/18. Repeat every 10 years  Mammogram status: Completed 10/11/21. Repeat every year  Bone Density status: Completed 10/11/21. Results reflect: Bone density results: OSTEOPOROSIS. Repeat every 3 years.   Additional Screening:  Hepatitis C Screening:  Completed 07/13/19  Vision Screening: Recommended annual ophthalmology exams for early detection of glaucoma and other disorders of the eye. Is the patient up to date with their annual eye exam?  Yes  Who is the provider or what is the name of the office in which the patient attends annual eye exams? Dr Fredrich Birks  If pt is not established with a provider, would they like to be referred to a provider to establish care? No .   Dental Screening: Recommended annual dental exams for proper oral hygiene    Community Resource Referral / Chronic Care Management: CRR required this visit?  No   CCM required this visit?  No     Plan:     I have personally reviewed and noted the following in the patient's chart:   Medical and social history Use of alcohol, tobacco or illicit drugs  Current medications and supplements including opioid prescriptions. Patient is not currently taking opioid prescriptions. Functional ability and status Nutritional status Physical activity Advanced directives List of other physicians Hospitalizations, surgeries, and ER visits in previous 12 months Vitals Screenings to include cognitive, depression, and falls Referrals and appointments  In addition, I have reviewed and discussed with patient certain preventive protocols, quality metrics, and best practice recommendations. A written personalized care plan for preventive services as well as general preventive health recommendations were provided to patient.     Marzella Schlein, LPN   06/12/3233    After Visit Summary: (MyChart) Due to this being a telephonic visit, the after visit summary with patients personalized plan was offered to patient via MyChart   Nurse Notes: none

## 2023-01-23 NOTE — Telephone Encounter (Signed)
Noted and agree that she needs evaluation. 

## 2023-01-23 NOTE — Telephone Encounter (Signed)
Called pt to follow up on mychart message sent today.  Pt reports blacking out yesterday.  She feels like she had too much sweet tea, its not something that she normally drinks.  Pt denies hitting her head during the fall and reports slight soreness in her knee and elbow. Pt denies any dizziness, dyspnea and weakness today.  Scheduled an appointment with Dr. Alphonsus Sias tomorrow 8/22 @ 2pm.   Instructed pt to go to ED tonight if she had any dizziness to passing out, pt verbalized understanding and agreement.

## 2023-01-24 ENCOUNTER — Encounter: Payer: Self-pay | Admitting: Internal Medicine

## 2023-01-24 ENCOUNTER — Ambulatory Visit (INDEPENDENT_AMBULATORY_CARE_PROVIDER_SITE_OTHER): Payer: Medicare Other | Admitting: Internal Medicine

## 2023-01-24 VITALS — BP 130/88 | HR 81 | Temp 97.9°F | Ht 60.0 in | Wt 135.0 lb

## 2023-01-24 DIAGNOSIS — R55 Syncope and collapse: Secondary | ICD-10-CM

## 2023-01-24 DIAGNOSIS — R5383 Other fatigue: Secondary | ICD-10-CM | POA: Diagnosis not present

## 2023-01-24 NOTE — Assessment & Plan Note (Addendum)
Did not have clear LOC Nothing worrisome in history or exam Will check EKG and labs If no findings--but has recurrence, would set up with cardiology  EKG shows sinus at 78 Normal axis and intervals. Left atrial enlargement but no other hypertrophy. No ischemic changes. No change from 08/16/21  Will do labs and she will let me know if any recurrence

## 2023-01-24 NOTE — Progress Notes (Signed)
Subjective:    Patient ID: Kelly James, female    DOB: February 26, 1956, 67 y.o.   MRN: [id number]  HPI Here due to spell--?fainting or near fainting  Was standing hugging her grandson 2 days ago (11-12 noon) Then noticed them stumbling some Then she found that they were on the floor He helped her up--and was fine after that  Had typical granola bar with coffee May not have had full breakfast No chest pain or SOB Did not clearly fully pass out No palpitations  No problems since  Current Outpatient Medications on File Prior to Visit  Medication Sig Dispense Refill   acyclovir (ZOVIRAX) 400 MG tablet Take 400 mg by mouth 2 (two) times daily.     ALFALFA PO Take by mouth.     Ascorbic Acid (CHEWABLE VITAMIN C PO) Take 100 mg by mouth.     Ascorbic Acid (VITA-C PO)      B Complex-Biotin-FA (B COMPLETE PO) Take by mouth.     CALCIUM PO Take 1,560 mg by mouth daily.     Cholecalciferol (D3-1000 PO) Take 1 tablet by mouth daily. Vitamin D     Misc Natural Products (JOINT SUPPORT COMPLEX PO) Take by mouth.     Nutritional Supplements (NUTRITIONAL SUPPLEMENT PO) Take by mouth. Stress Relief Complex-One tablet daily     Nutritional Supplements (NUTRITIONAL SUPPLEMENT PO) Shaklee Collagen 9-2 scoops daily     Nutritional Supplements (NUTRITIONAL SUPPLEMENT PO) Take by mouth. Shaklee Cholesterol Reduction - 2 tablets by mouth twice a day     Nutritional Supplements (NUTRITIONAL SUPPLEMENT PO) Take by mouth. Shaklee Co Q Heart-1 capsule by mouth daily     Nutritional Supplements (SOY PROTEIN SHAKE) POWD Take by mouth.     Omega-3 Fatty Acids (FISH OIL) 1000 MG CAPS Take by mouth.     Propylene Glycol (SYSTANE BALANCE OP) Apply to eye.     TART CHERRY PO Take by mouth.     TURMERIC PO Take by mouth.     UNABLE TO FIND Med Name: CarotoMax and Eye Taking 1 capsule by mouth after breakfast daily     UNABLE TO FIND Herb-Lax Taking 1 tablet by mouth after breakfast daily     UNABLE TO FIND Med  Name: Mind Work     VITAMIN E PO Take by mouth.     Zinc Sulfate (ZINC 15 PO) Take by mouth.     No current facility-administered medications on file prior to visit.    Allergies  Allergen Reactions   Codeine     Other reaction(s): Agitation   Sulfa Antibiotics Hives    Past Medical History:  Diagnosis Date   Atypical mole 09/10/2019   L ant thigh near the knee    Migraines    UTI (urinary tract infection)     Past Surgical History:  Procedure Laterality Date   TUBAL LIGATION  1984    Family History  Problem Relation Age of Onset   Arthritis Mother    Alzheimer's disease Mother    Alcohol abuse Father    Asthma Sister    Asthma Brother     Social History   Socioeconomic History   Marital status: Married    Spouse name: Not on file   Number of children: Not on file   Years of education: Not on file   Highest education level: Not on file  Occupational History   Not on file  Tobacco Use   Smoking status: Never   Smokeless  tobacco: Never  Vaping Use   Vaping status: Never Used  Substance and Sexual Activity   Alcohol use: No   Drug use: No   Sexual activity: Not on file  Other Topics Concern   Not on file  Social History Narrative   Married.   2 children, 2 grandchildren.   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   Enjoys baking.   Social Determinants of Health   Financial Resource Strain: Low Risk  (12/24/2022)   Overall Financial Resource Strain (CARDIA)    Difficulty of Paying Living Expenses: Not hard at all  Food Insecurity: No Food Insecurity (12/24/2022)   Hunger Vital Sign    Worried About Running Out of Food in the Last Year: Never true    Ran Out of Food in the Last Year: Never true  Transportation Needs: No Transportation Needs (12/24/2022)   PRAPARE - Administrator, Civil Service (Medical): No    Lack of Transportation (Non-Medical): No  Physical Activity: Insufficiently Active (12/24/2022)   Exercise Vital Sign    Days of  Exercise per Week: 3 days    Minutes of Exercise per Session: 30 min  Stress: No Stress Concern Present (12/24/2022)   [employer] of Occupational Health - Occupational Stress Questionnaire    Feeling of Stress : Not at all  Social Connections: Moderately Isolated (12/24/2022)   Social Connection and Isolation Panel [NHANES]    Frequency of Communication with Friends and Family: More than three times a week    Frequency of Social Gatherings with Friends and Family: Patient declined    Attends Religious Services: Never    Database administrator or Organizations: No    Attends Banker Meetings: Never    Marital Status: Married  Catering manager Violence: Not At Risk (12/25/2022)   Humiliation, Afraid, Rape, and Kick questionnaire    Fear of Current or Ex-Partner: No    Emotionally Abused: No    Physically Abused: No    Sexually Abused: No   Review of Systems No headache No recent vision changes No weakness, abnormal sensations or stroke type symptoms May get tired feeling--just playing game on her phone Mother in law lives with them---she doesn't need help with ADLs though Sleeps okay Appetite is normal for her---did eat a lot of sweets before this spell though    Objective:   Physical Exam Constitutional:      Appearance: Normal appearance.  Cardiovascular:     Rate and Rhythm: Normal rate and regular rhythm.     Pulses: Normal pulses.     Heart sounds: No murmur heard.    No gallop.  Pulmonary:     Effort: Pulmonary effort is normal.     Breath sounds: Normal breath sounds. No wheezing or rales.  Abdominal:     Palpations: Abdomen is soft.     Tenderness: There is no abdominal tenderness.  Musculoskeletal:     Cervical back: Neck supple.     Right lower leg: No edema.     Left lower leg: No edema.  Lymphadenopathy:     Cervical: No cervical adenopathy.  Neurological:     General: No focal deficit present.     Mental Status: She is alert.   Psychiatric:        Mood and Affect: Mood normal.        Behavior: Behavior normal.            Assessment & Plan:

## 2023-01-24 NOTE — Telephone Encounter (Signed)
I will check her at today's visit

## 2023-01-25 LAB — COMPREHENSIVE METABOLIC PANEL
ALT: 22 U/L (ref 0–35)
AST: 23 U/L (ref 0–37)
Albumin: 4.5 g/dL (ref 3.5–5.2)
Alkaline Phosphatase: 52 U/L (ref 39–117)
BUN: 21 mg/dL (ref 6–23)
CO2: 30 meq/L (ref 19–32)
Calcium: 9.8 mg/dL (ref 8.4–10.5)
Chloride: 101 meq/L (ref 96–112)
Creatinine, Ser: 0.88 mg/dL (ref 0.40–1.20)
GFR: 67.81 mL/min (ref >60.00)
Glucose, Bld: 113 mg/dL — ABNORMAL HIGH (ref 70–99)
Potassium: 4.1 meq/L (ref 3.5–5.1)
Sodium: 140 meq/L (ref 135–145)
Total Bilirubin: 0.6 mg/dL (ref 0.2–1.2)
Total Protein: 7.4 g/dL (ref 6.0–8.3)

## 2023-01-25 LAB — CBC
HCT: 41.8 % (ref 36.0–46.0)
Hemoglobin: 13.8 g/dL (ref 12.0–15.0)
MCHC: 32.9 g/dL (ref 30.0–36.0)
MCV: 101.4 fl — ABNORMAL HIGH (ref 78.0–100.0)
Platelets: 271 10*3/uL (ref 150.0–400.0)
RBC: 4.12 Mil/uL (ref 3.87–5.11)
RDW: 12.6 % (ref 11.5–15.5)
WBC: 6.2 10*3/uL (ref 4.0–10.5)

## 2023-01-25 LAB — TSH: TSH: 2.76 u[IU]/mL (ref 0.35–5.50)

## 2023-01-26 ENCOUNTER — Encounter: Payer: Self-pay | Admitting: Internal Medicine

## 2023-02-15 DIAGNOSIS — H04123 Dry eye syndrome of bilateral lacrimal glands: Secondary | ICD-10-CM | POA: Diagnosis not present

## 2023-03-11 DIAGNOSIS — Z23 Encounter for immunization: Secondary | ICD-10-CM | POA: Diagnosis not present

## 2023-08-08 ENCOUNTER — Encounter: Payer: Self-pay | Admitting: Family Medicine

## 2023-08-08 ENCOUNTER — Ambulatory Visit: Admitting: Family Medicine

## 2023-08-08 ENCOUNTER — Ambulatory Visit
Admission: RE | Admit: 2023-08-08 | Discharge: 2023-08-08 | Disposition: A | Discharge status: Home / Self Care | Source: Ambulatory Visit | Attending: Family Medicine | Admitting: Family Medicine

## 2023-08-08 VITALS — BP 110/70 | HR 93 | Temp 98.0°F | Ht 60.0 in | Wt 137.0 lb

## 2023-08-08 DIAGNOSIS — Z789 Other specified health status: Secondary | ICD-10-CM | POA: Diagnosis not present

## 2023-08-08 DIAGNOSIS — M25551 Pain in right hip: Secondary | ICD-10-CM

## 2023-08-08 DIAGNOSIS — R1031 Right lower quadrant pain: Secondary | ICD-10-CM | POA: Diagnosis not present

## 2023-08-08 LAB — POC URINALSYSI DIPSTICK (AUTOMATED)
Bilirubin, UA: NEGATIVE
Blood, UA: NEGATIVE
Glucose, UA: NEGATIVE
Ketones, UA: NEGATIVE
Leukocytes, UA: NEGATIVE
Nitrite, UA: NEGATIVE
Protein, UA: NEGATIVE
Spec Grav, UA: 1.025 (ref 1.010–1.025)
Urobilinogen, UA: 0.2 U/dL
pH, UA: 5.5 (ref 5.0–8.0)

## 2023-08-08 NOTE — Progress Notes (Signed)
 Kelly James T. Darald Uzzle, MD, CAQ Sports Medicine [facility] [address] [place], [address]  Phone: [phone]  FAX: [phone]  Kelly James - 68 y.o. female  MRN [id number]  Date of Birth: 1955-12-09  Date: 08/08/2023  PCP: Kelly Nest, NP  Referral: Kelly Nest, NP  Chief Complaint  Patient presents with   Leg Pain    Right Groin Area when walking-Radiates down Side of Leg-Not currently bothering her today   Subjective:   Kelly James is a 68 y.o. very pleasant female patient with Body mass index is 26.76 kg/m. who presents with the following:  That patient presents with some ongoing right-sided leg pain.  She was having some right anterior groin pain and was having pain when she was walking and having some rotational maneuvers.  She did take some Tylenol, and that seemed to help with your pain.  She has also been doing more yoga and Pilates, and she also think this has been helping her system and strength.  The hip and groin pain started in December and it fluctuated a bit in the interval time.  She does note that she has some mid back pain bothers her when she is standing and doing a lot of work and chores in her home.  Started again for 5 days ago, and is doing quite a better right now.  She also is concerned she could have some urinary symptoms and wanted to check a urine sample.  She does not have any blood in her urine.  Review of Systems is noted in the HPI, as appropriate  Objective:   BP 110/70 (BP Location: Left Arm, Patient Position: Sitting, Cuff Size: Normal)   Pulse 93   Temp 98 F (36.7 C) (Temporal)   Ht 5' (1.524 m)   Wt 137 lb (62.1 kg)   SpO2 99%   BMI 26.76 kg/m   GEN: No acute distress; alert,appropriate. PULM: Breathing comfortably in no respiratory distress PSYCH: Normally interactive.    HIP EXAM: SIDE: Right ROM: Abduction, Flexion, Internal and External range of motion: Full Pain  with terminal IROM and EROM: None GTB: NT SLR: NEG Knees: No effusion FABER: NT REVERSE FABER: NT, neg Piriformis: NT at direct palpation Str: flexion: 5/5 abduction: 5/5 adduction: 5/5 Strength testing non-tender    Laboratory and Imaging Data: Results for orders placed or performed in visit on 08/08/23  POCT Urinalysis Dipstick (Automated)   Collection Time: 08/08/23  3:29 PM  Result Value Ref Range   Color, UA Yellow    Clarity, UA Clear    Glucose, UA Negative Negative   Bilirubin, UA Negative    Ketones, UA Negative    Spec Grav, UA 1.025 1.010 - 1.025   Blood, UA Negative    pH, UA 5.5 5.0 - 8.0   Protein, UA Negative Negative   Urobilinogen, UA 0.2 0.2 or 1.0 E.U./dL   Nitrite, UA Negative    Leukocytes, UA Negative Negative     Assessment and Plan:     ICD-10-CM   1. Right hip pain  M25.551 DG Hip Unilat W OR W/O Pelvis 2-3 Views Right    2. Normal urinalysis  Z78.9 POCT Urinalysis Dipstick (Automated)     Radiographs of the right hip show a preserved joint space with minimal to no degenerative changes.  By history, I think that she probably was having some intra-articular inflammation at the time that she was having her groin pain.  At this point is doing better, encouraged her to keep up being physically active, working out, and I applauded her for her yoga.  Thank you this will help keep this at bay.  She could also do some NSAIDs if she has a flareup of her hip.  She had some concern about the urine causing her hip and back pain, she has a normal urinalysis.  Medication Management during today's office visit: No orders of the defined types were placed in this encounter.  There are no discontinued medications.  Orders placed today for conditions managed today: Orders Placed This Encounter  Procedures   DG Hip Unilat W OR W/O Pelvis 2-3 Views Right   POCT Urinalysis Dipstick (Automated)    Disposition: No follow-ups on file.  [employer] One  speech-to-text software was used for transcription in this dictation.  Possible transcriptional errors can occur using Animal nutritionist.   Signed,  Kelly Galea. Minervia Osso, MD   Outpatient Encounter Medications as of 08/08/2023  Medication Sig   acyclovir (ZOVIRAX) 400 MG tablet Take 400 mg by mouth 2 (two) times daily.   ALFALFA PO Take by mouth.   Ascorbic Acid (CHEWABLE VITAMIN C PO) Take 100 mg by mouth.   Ascorbic Acid (VITA-C PO)    B Complex-Biotin-FA (B COMPLETE PO) Take by mouth.   CALCIUM PO Take 1,560 mg by mouth daily.   Cholecalciferol (D3-1000 PO) Take 1 tablet by mouth daily. Vitamin D   Misc Natural Products (JOINT SUPPORT COMPLEX PO) Take by mouth.   Nutritional Supplements (NUTRITIONAL SUPPLEMENT PO) Take by mouth. Stress Relief Complex-One tablet daily   Nutritional Supplements (NUTRITIONAL SUPPLEMENT PO) Shaklee Collagen 9-2 scoops daily   Nutritional Supplements (NUTRITIONAL SUPPLEMENT PO) Take by mouth. Shaklee Cholesterol Reduction - 2 tablets by mouth twice a day   Nutritional Supplements (NUTRITIONAL SUPPLEMENT PO) Take by mouth. Shaklee Co Q Heart-1 capsule by mouth daily   Nutritional Supplements (SOY PROTEIN SHAKE) POWD Take by mouth.   Omega-3 Fatty Acids (FISH OIL) 1000 MG CAPS Take by mouth.   OVER THE COUNTER MEDICATION Optiflora Prebiotic & Probiotic System-1 tablet by mouth every morning   Propylene Glycol (SYSTANE BALANCE OP) Apply to eye.   TART CHERRY PO Take 3,600 mg by mouth daily.   TURMERIC PO Take by mouth.   UNABLE TO FIND Med Name: CarotoMax and Eye Taking 1 capsule by mouth after breakfast daily   UNABLE TO FIND Herb-Lax Taking 1 tablet by mouth after breakfast daily   UNABLE TO FIND Med Name: Mind Work   VITAMIN E PO Take by mouth.   Zinc Sulfate (ZINC 15 PO) Take by mouth.   No facility-administered encounter medications on file as of 08/08/2023.

## 2023-08-19 DIAGNOSIS — H40013 Open angle with borderline findings, low risk, bilateral: Secondary | ICD-10-CM | POA: Diagnosis not present

## 2023-08-19 DIAGNOSIS — H524 Presbyopia: Secondary | ICD-10-CM | POA: Diagnosis not present

## 2023-08-29 DIAGNOSIS — M816 Localized osteoporosis [Lequesne]: Secondary | ICD-10-CM | POA: Diagnosis not present

## 2023-11-04 DIAGNOSIS — Z1231 Encounter for screening mammogram for malignant neoplasm of breast: Secondary | ICD-10-CM | POA: Diagnosis not present

## 2023-11-18 ENCOUNTER — Ambulatory Visit: Admitting: Dermatology

## 2023-11-28 ENCOUNTER — Ambulatory Visit (INDEPENDENT_AMBULATORY_CARE_PROVIDER_SITE_OTHER): Admitting: Primary Care

## 2023-11-28 ENCOUNTER — Ambulatory Visit (INDEPENDENT_AMBULATORY_CARE_PROVIDER_SITE_OTHER)
Admission: RE | Admit: 2023-11-28 | Discharge: 2023-11-28 | Disposition: A | Discharge status: Home / Self Care | Source: Ambulatory Visit | Attending: Primary Care | Admitting: Primary Care

## 2023-11-28 ENCOUNTER — Ambulatory Visit: Payer: Self-pay | Admitting: Primary Care

## 2023-11-28 VITALS — BP 118/62 | HR 82 | Temp 97.3°F | Ht 60.0 in | Wt 133.0 lb

## 2023-11-28 DIAGNOSIS — G8929 Other chronic pain: Secondary | ICD-10-CM

## 2023-11-28 DIAGNOSIS — M546 Pain in thoracic spine: Secondary | ICD-10-CM

## 2023-11-28 DIAGNOSIS — M5134 Other intervertebral disc degeneration, thoracic region: Secondary | ICD-10-CM | POA: Diagnosis not present

## 2023-11-28 NOTE — Patient Instructions (Signed)
You will either be contacted via phone regarding your referral to physical therapy, or you may receive a letter on your MyChart portal from our referral team with instructions for scheduling an appointment. Please let us know if you have not been contacted by anyone within two weeks.  Complete xray(s) prior to leaving today. I will notify you of your results once received.  It was a pleasure to see you today!

## 2023-11-28 NOTE — Assessment & Plan Note (Signed)
 Differentials include MSK, bony involvement.  Less representative of nerve involvement.  X-ray of of the thoracic spine ordered and pending. Referral placed to physical therapy.

## 2023-11-28 NOTE — Progress Notes (Signed)
 Subjective:    Patient ID: Kelly James, female    DOB: May 28, 1956, 68 y.o.   MRN: [id number]  Back Pain Pertinent negatives include no numbness.    Kelly James is a very pleasant 68 y.o. female with a history of joint stiffness, shoulder pain, knee pain who presents today to discuss joint pain.  Chronic history of bilateral mid thoracic back pain. Her pain is intermittent which is mostly bothersome when standing for longer periods of time. More recently she's been on her feet and cleaning out her late mother-in-law's bedroom. She cannot describe her pain, denies tightness/tense, sharp/shooting pain.   Her pain improves with sitting and laying down. She denies lower back pain, hip pain, neck pain. She's not been exercising in the last few weeks.   She's been taking Tylenol Arthritis intermittently without improvement.    Review of Systems  Musculoskeletal:  Positive for back pain.  Skin:  Negative for color change.  Neurological:  Negative for numbness.         Past Medical History:  Diagnosis Date   Atypical mole 09/10/2019   L ant thigh near the knee    Migraines    UTI (urinary tract infection)     Social History   Socioeconomic History   Marital status: Married    Spouse name: Not on file   Number of children: Not on file   Years of education: Not on file   Highest education level: 12th grade  Occupational History   Not on file  Tobacco Use   Smoking status: Never   Smokeless tobacco: Never  Vaping Use   Vaping status: Never Used  Substance and Sexual Activity   Alcohol use: No   Drug use: No   Sexual activity: Not on file  Other Topics Concern   Not on file  Social History Narrative   Married.   2 children, 2 grandchildren.   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   Enjoys baking.   Social Drivers of Corporate investment banker Strain: Low Risk  (11/28/2023)   Overall Financial Resource Strain (CARDIA)    Difficulty of Paying Living Expenses: Not  hard at all  Food Insecurity: No Food Insecurity (11/28/2023)   Hunger Vital Sign    Worried About Running Out of Food in the Last Year: Never true    Ran Out of Food in the Last Year: Never true  Transportation Needs: No Transportation Needs (11/28/2023)   PRAPARE - Administrator, Civil Service (Medical): No    Lack of Transportation (Non-Medical): No  Physical Activity: Unknown (11/28/2023)   Exercise Vital Sign    Days of Exercise per Week: Patient declined    Minutes of Exercise per Session: Not on file  Stress: Patient Declined (11/28/2023)   [employer] of Occupational Health - Occupational Stress Questionnaire    Feeling of Stress: Patient declined  Social Connections: Unknown (11/28/2023)   Social Connection and Isolation Panel    Frequency of Communication with Friends and Family: Patient declined    Frequency of Social Gatherings with Friends and Family: Patient declined    Attends Religious Services: Patient declined    Database administrator or Organizations: No    Attends Engineer, structural: Not on file    Marital Status: Married  Catering manager Violence: Not At Risk (12/25/2022)   Humiliation, Afraid, Rape, and Kick questionnaire    Fear of Current or Ex-Partner: No    Emotionally Abused:  No    Physically Abused: No    Sexually Abused: No    Past Surgical History:  Procedure Laterality Date   TUBAL LIGATION  1984    Family History  Problem Relation Age of Onset   Arthritis Mother    Alzheimer's disease Mother    Alcohol abuse Father    Asthma Sister    Asthma Brother     Allergies  Allergen Reactions   Codeine     Other reaction(s): Agitation   Sulfa Antibiotics Hives    Current Outpatient Medications on File Prior to Visit  Medication Sig Dispense Refill   acyclovir (ZOVIRAX) 400 MG tablet Take 400 mg by mouth 2 (two) times daily.     ALFALFA PO Take by mouth.     Ascorbic Acid (CHEWABLE VITAMIN C PO) Take 100 mg by  mouth.     Ascorbic Acid (VITA-C PO)      B Complex-Biotin-FA (B COMPLETE PO) Take by mouth.     CALCIUM PO Take 1,560 mg by mouth daily.     Cholecalciferol (D3-1000 PO) Take 1 tablet by mouth daily. Vitamin D     Misc Natural Products (JOINT SUPPORT COMPLEX PO) Take by mouth.     Nutritional Supplements (NUTRITIONAL SUPPLEMENT PO) Take by mouth. Stress Relief Complex-One tablet daily     Nutritional Supplements (NUTRITIONAL SUPPLEMENT PO) Shaklee Collagen 9-2 scoops daily     Nutritional Supplements (NUTRITIONAL SUPPLEMENT PO) Take by mouth. Shaklee Cholesterol Reduction - 2 tablets by mouth twice a day     Nutritional Supplements (NUTRITIONAL SUPPLEMENT PO) Take by mouth. Shaklee Co Q Heart-1 capsule by mouth daily     Nutritional Supplements (SOY PROTEIN SHAKE) POWD Take by mouth.     Omega-3 Fatty Acids (FISH OIL) 1000 MG CAPS Take by mouth.     OVER THE COUNTER MEDICATION Optiflora Prebiotic & Probiotic System-1 tablet by mouth every morning     Propylene Glycol (SYSTANE BALANCE OP) Apply to eye.     TART CHERRY PO Take 3,600 mg by mouth daily.     TURMERIC PO Take by mouth.     UNABLE TO FIND Med Name: CarotoMax and Eye Taking 1 capsule by mouth after breakfast daily     UNABLE TO FIND Herb-Lax Taking 1 tablet by mouth after breakfast daily     UNABLE TO FIND Med Name: Mind Work     VITAMIN E PO Take by mouth.     Zinc Sulfate (ZINC 15 PO) Take by mouth.     No current facility-administered medications on file prior to visit.    BP 118/62   Pulse 82   Temp (!) 97.3 F (36.3 C) (Temporal)   Ht 5' (1.524 m)   Wt 133 lb (60.3 kg)   SpO2 98%   BMI 25.97 kg/m  Objective:   Physical Exam  Cardiovascular:     Rate and Rhythm: Normal rate and regular rhythm.  Pulmonary:     Effort: Pulmonary effort is normal.     Breath sounds: Normal breath sounds.   Musculoskeletal:     Cervical back: Neck supple.     Thoracic back: No tenderness or bony tenderness. Normal range of  motion.     Lumbar back: Normal range of motion. Negative right straight leg raise test and negative left straight leg raise test.       Back:   Skin:    General: Skin is warm and dry.   Neurological:  Mental Status: She is alert and oriented to person, place, and time.   Psychiatric:        Mood and Affect: Mood normal.           Assessment & Plan:  Chronic midline thoracic back pain Assessment & Plan: Differentials include MSK, bony involvement.  Less representative of nerve involvement.  X-ray of of the thoracic spine ordered and pending. Referral placed to physical therapy.  Orders: -     DG Thoracic Spine W/Swimmers -     Ambulatory referral to Physical Therapy        [place] MARLA Gaskins, NP

## 2023-12-03 ENCOUNTER — Ambulatory Visit: Attending: Internal Medicine | Admitting: Physical Therapy

## 2023-12-03 DIAGNOSIS — M549 Dorsalgia, unspecified: Secondary | ICD-10-CM | POA: Diagnosis not present

## 2023-12-03 DIAGNOSIS — M5459 Other low back pain: Secondary | ICD-10-CM | POA: Insufficient documentation

## 2023-12-03 DIAGNOSIS — M546 Pain in thoracic spine: Secondary | ICD-10-CM | POA: Diagnosis not present

## 2023-12-03 DIAGNOSIS — G8929 Other chronic pain: Secondary | ICD-10-CM | POA: Insufficient documentation

## 2023-12-03 NOTE — Therapy (Unsigned)
 OUTPATIENT PHYSICAL THERAPY THORACOLUMBAR EVALUATION   Patient Name: Kelly James MRN: [id number] DOB:1955/07/30, 68 y.o., female Today's Date: 12/03/2023  END OF SESSION:  PT End of Session - 12/03/23 1814     Visit Number 1    Number of Visits 24    Date for PT Re-Evaluation 02/25/24    Authorization Type [employer] 2025    Authorization - Visit Number 1    Authorization - Number of Visits 24    Progress Note Due on Visit 10    PT Start Time 1430    PT Stop Time 1515    PT Time Calculation (min) 45 min    Activity Tolerance Patient tolerated treatment well    Behavior During Therapy [facility] for tasks assessed/performed          Past Medical History:  Diagnosis Date   Atypical mole 09/10/2019   L ant thigh near the knee    Migraines    UTI (urinary tract infection)    Past Surgical History:  Procedure Laterality Date   TUBAL LIGATION  1984   Patient Active Problem List   Diagnosis Date Noted   Chronic midline thoracic back pain 11/28/2023   Fainting spell 01/24/2023   Foot mass, left 11/21/2022   Urinary frequency 06/20/2022   Urinary incontinence 06/20/2022   Acute shoulder pain 11/03/2021   Palpitations 08/16/2021   Other fatigue 07/05/2021   Rash and nonspecific skin eruption 05/02/2021   Elevated blood pressure reading in office without diagnosis of hypertension 05/02/2021   Frequent headaches 10/21/2020   Joint stiffness 03/03/2020   Genital herpes 03/03/2020   Preventative health care 07/17/2019   Hyperlipidemia 07/17/2019   GERD (gastroesophageal reflux disease) 07/17/2019   Vertigo 08/27/2018   Knee pain 05/03/2017    PCP: Comer Gaskins NP  REFERRING PROVIDER: Comer Gaskins NP   REFERRING DIAG: [phone] (ICD-10-CM) - Chronic midline thoracic back pain  Rationale for Evaluation and Treatment: Rehabilitation  THERAPY DIAG:  Other low back pain  Mid back pain  ONSET DATE: Started 1992    SUBJECTIVE:                                                                                                                                                                                            SUBJECTIVE STATEMENT: See pertinent history    PERTINENT HISTORY:  Pt reports that she started to experiencing pain when gardening, but it is not until recently that started to notice it more. 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 She has increased mid and low back pain when bending forward for long periods  of time. She does not experience increased pain in the morning, but just when she is performing baking related tasks.    PAIN:  Are you having pain? Yes: NPRS scale: 6/10 NRPS worst   Pain location: Lumbar and thoracic central spinous  Pain description: Dull  Aggravating factors: Bending forward while baking or bending forward and then coming back up  Relieving factors: Ibuprofen    PRECAUTIONS: None  RED FLAGS: None   WEIGHT BEARING RESTRICTIONS: No  FALLS:  Has patient fallen in last 6 months? No  LIVING ENVIRONMENT: Lives with: lives with their family Lives in: House/apartment Stairs: Yes: Internal: 13 steps; on right going up and External: 6 steps; on right going up Has following equipment at home: Single point cane  OCCUPATION: Retired  PLOF: Independent  PATIENT GOALS: Pt wants to experience less pain when working and know how to treat this condition.     OBJECTIVE:  Note: Objective measures were completed at Evaluation unless otherwise noted.   VITALS:   DIAGNOSTIC FINDINGS:  NEXT MD VISIT: CLINICAL DATA:  chronic thoracic back pain   EXAM: THORACIC SPINE - 3 VIEWS   COMPARISON:  None Available.   FINDINGS: Eleven rib-bearing thoracic type vertebral bodies. Otherwise, normal alignment with expected thoracic kyphosis. The cervicothoracic junction alignment is normal. Vertebral body heights are well maintained without acute fracture. Mild intervertebral disc height loss  involving a couple of midthoracic vertebral bodies with fairly diffuse osteophyte formation throughout the thoracic spine. The soft tissues are otherwise unremarkable.   IMPRESSION: 1. No acute fracture or malalignment of the thoracic spine. 2. Mild degenerative disc disease within the midthoracic spine.     Electronically Signed   By: Rogelia Myers M.D.   On: 11/28/2023 10:48  PATIENT SURVEYS:  Modified Oswestry:  MODIFIED OSWESTRY DISABILITY SCALE  Date: 12/03/23 Score  Pain intensity   2. Personal care (washing, dressing, etc.)   3. Lifting   4. Walking   5. Sitting   6. Standing   7. Sleeping   8. Social Life   9. Traveling   10. Employment/ Homemaking   Total /50   Interpretation of scores: Score Category Description  0-20% Minimal Disability The patient can cope with most living activities. Usually no treatment is indicated apart from advice on lifting, sitting and exercise  21-40% Moderate Disability The patient experiences more pain and difficulty with sitting, lifting and standing. Travel and social life are more difficult and they may be disabled from work. Personal care, sexual activity and sleeping are not grossly affected, and the patient can usually be managed by conservative means  41-60% Severe Disability Pain remains the main problem in this group, but activities of daily living are affected. These patients require a detailed investigation  61-80% Crippled Back pain impinges on all aspects of the patient's life. Positive intervention is required  81-100% Bed-bound  These patients are either bed-bound or exaggerating their symptoms  Bluford FORBES Zoe DELENA Karon DELENA, et al. Surgery versus conservative management of stable thoracolumbar fracture: the PRESTO feasibility RCT. [place] ([place]): [employer]; 2021 Nov. [facility] Technology Assessment, No. 25.62.) Appendix 3, Oswestry Disability Index category descriptors. Available from:  [email or web address]cz  Minimally Clinically Important Difference (MCID) = 12.8%  COGNITION: Overall cognitive status: Within functional limits for tasks assessed     SENSATION: WFL  MUSCLE LENGTH: Hamstrings: Right *** deg; Left *** deg Thomas test: Right *** deg; Left *** deg  POSTURE: rounded shoulders  PALPATION: No area TTP, increased  tension in thoracic paraspinals   LUMBAR ROM:   AROM eval  Flexion 100%  Extension 100%  Right lateral flexion 100%  Left lateral flexion 100%  Right rotation 100%  Left rotation 100%   (Blank rows = not tested)  LOWER EXTREMITY ROM:     Active  Right eval Left eval  Hip flexion    Hip extension    Hip abduction    Hip adduction    Hip internal rotation    Hip external rotation    Knee flexion    Knee extension    Ankle dorsiflexion    Ankle plantarflexion    Ankle inversion    Ankle eversion     (Blank rows = not tested)  LOWER EXTREMITY MMT:    MMT Right eval Left eval  Hip flexion 4 4  Hip extension 4- 4-  Hip abduction 4 4  Hip adduction    Hip internal rotation    Hip external rotation    Knee flexion    Knee extension    Ankle dorsiflexion    Ankle plantarflexion    Ankle inversion    Ankle eversion     (Blank rows = not tested)  LUMBAR SPECIAL TESTS:  {lumbar special test:25242}  FUNCTIONAL TESTS:  {Functional tests:24029}  GAIT: Distance walked: *** Assistive device utilized: {Assistive devices:23999} Level of assistance: {Levels of assistance:24026} Comments: ***  TREATMENT DATE: 12/03/23: LTR 2 X 10 with 3 sec hold                                                                                                                                 PATIENT EDUCATION:  Education details: *** Person educated: {Person educated:25204} Education method: {Education Method:25205} Education comprehension: {Education Comprehension:25206}  HOME EXERCISE PROGRAM: Access Code:  [email or web address] URL: [email or web address] Date: 12/03/2023 Prepared by: Toribio Servant  Exercises - Supine Lower Trunk Rotation  - 1 x daily - 7 x weekly - 2 sets - 10 reps - 3 sec  hold - Cat Cow  - 1 x daily - 7 x weekly - 2 sets - 10 reps - 5-10 sec  hold - Child's Pose Stretch  - 1 x daily - 7 x weekly - 3 reps - 30-60 sec hold - Supine Bridge  - 3-4 x weekly - 3 sets - 10 reps  ASSESSMENT:  CLINICAL IMPRESSION: Patient is a *** y.o. *** who was seen today for physical therapy evaluation and treatment for ***.   OBJECTIVE IMPAIRMENTS: {opptimpairments:25111}.   ACTIVITY LIMITATIONS: {activitylimitations:27494}  PARTICIPATION LIMITATIONS: {participationrestrictions:25113}  PERSONAL FACTORS: {Personal factors:25162} are also affecting patient's functional outcome.   REHAB POTENTIAL: {rehabpotential:25112}  CLINICAL DECISION MAKING: {clinical decision making:25114}  EVALUATION COMPLEXITY: {Evaluation complexity:25115}   GOALS: Goals reviewed with patient? {yes/no:20286}  SHORT TERM GOALS: Target date: ***  *** Baseline: Goal status: INITIAL  2.  *** Baseline:  Goal status: INITIAL  3.  *** Baseline:  Goal status:  INITIAL  4.  *** Baseline:  Goal status: INITIAL  5.  *** Baseline:  Goal status: INITIAL  6.  *** Baseline:  Goal status: INITIAL  LONG TERM GOALS: Target date: ***  *** Baseline:  Goal status: INITIAL  2.  *** Baseline:  Goal status: INITIAL  3.  *** Baseline:  Goal status: INITIAL  4.  *** Baseline:  Goal status: INITIAL  5.  *** Baseline:  Goal status: INITIAL  6.  *** Baseline:  Goal status: INITIAL  PLAN:  PT FREQUENCY: 1-2x/week  PT DURATION: 12 weeks  PLANNED INTERVENTIONS: {rehab planned interventions:25118::97110-Therapeutic exercises,97530- Therapeutic [id number]- Neuromuscular re-education,97535- Self Rjmz,02859- Manual therapy}.  PLAN FOR NEXT SESSION: ***  Toribio Servant PT,  DPT  [facility] [address] [place], [address] Phone: [phone]   Fax:  [phone]

## 2023-12-04 ENCOUNTER — Ambulatory Visit: Admitting: Dermatology

## 2023-12-04 ENCOUNTER — Encounter: Payer: Self-pay | Admitting: Dermatology

## 2023-12-04 DIAGNOSIS — D2262 Melanocytic nevi of left upper limb, including shoulder: Secondary | ICD-10-CM

## 2023-12-04 DIAGNOSIS — L578 Other skin changes due to chronic exposure to nonionizing radiation: Secondary | ICD-10-CM | POA: Diagnosis not present

## 2023-12-04 DIAGNOSIS — W908XXA Exposure to other nonionizing radiation, initial encounter: Secondary | ICD-10-CM

## 2023-12-04 DIAGNOSIS — L918 Other hypertrophic disorders of the skin: Secondary | ICD-10-CM | POA: Diagnosis not present

## 2023-12-04 DIAGNOSIS — L821 Other seborrheic keratosis: Secondary | ICD-10-CM

## 2023-12-04 DIAGNOSIS — L820 Inflamed seborrheic keratosis: Secondary | ICD-10-CM

## 2023-12-04 NOTE — Progress Notes (Signed)
   Follow-Up Visit   Subjective  Kelly James is a 68 y.o. female who presents for the following: Irritated skin lesions on the neck, face, and hip that are itchy, and rub on clothing. Pt would like to discuss treatment options today.  The following portions of the chart were reviewed this encounter and updated as appropriate: medications, allergies, medical history  Review of Systems:  No other skin or systemic complaints except as noted in HPI or Assessment and Plan.  Objective  Well appearing patient in no apparent distress; mood and affect are within normal limits.   A focused examination was performed of the following areas: the face, neck, arms, hands, and R hip  Relevant exam findings are noted in the Assessment and Plan.  Neck x 12, L face x 3, R temple x 3 (18) Erythematous stuck-on, waxy papule or plaque Neck x 3 (3) Fleshy, skin-colored pedunculated papules.    Assessment & Plan   INFLAMED SEBORRHEIC KERATOSIS (18) Neck x 12, L face x 3, R temple x 3 (18) Symptomatic, irritating, patient would like treated.  Destruction of lesion - Neck x 12, L face x 3, R temple x 3 (18) Complexity: simple   Destruction method: cryotherapy   Informed consent: discussed and consent obtained   Timeout:  patient name, date of birth, surgical site, and procedure verified Lesion destroyed using liquid nitrogen: Yes   Region frozen until ice ball extended beyond lesion: Yes   Outcome: patient tolerated procedure well with no complications   Post-procedure details: wound care instructions given    SKIN TAG (3) Neck x 3 (3) Symptomatic, irritating, patient would like treated.  Destruction of lesion - Neck x 3 (3) Complexity: simple   Destruction method: cryotherapy   Informed consent: discussed and consent obtained   Timeout:  patient name, date of birth, surgical site, and procedure verified Lesion destroyed using liquid nitrogen: Yes   Region frozen until ice ball extended beyond  lesion: Yes   Outcome: patient tolerated procedure well with no complications   Post-procedure details: wound care instructions given     SEBORRHEIC KERATOSIS - Stuck-on, waxy, tan-brown papules and/or plaques  - Benign-appearing - Discussed benign etiology and prognosis. - Observe - Call for any changes  MELANOCYTIC NEVUS Exam: Flesh colored papule on the L wrist. Treatment Plan: Benign appearing on exam today. Recommend observation. Call clinic for new or changing moles. Recommend daily use of broad spectrum spf 30+ sunscreen to sun-exposed areas.   ACTINIC DAMAGE - chronic, secondary to cumulative UV radiation exposure/sun exposure over time - diffuse scaly erythematous macules with underlying dyspigmentation - Recommend daily broad spectrum sunscreen SPF 30+ to sun-exposed areas, reapply every 2 hours as needed.  - Recommend staying in the shade or wearing long sleeves, sun glasses (UVA+UVB protection) and wide brim hats (4-inch brim around the entire circumference of the hat). - Call for new or changing lesions.  Return in about 1 year (around 12/03/2024) for ISK follow up and TBSE, hx of dysplastic nevus.  LILLETTE Rosina Mayans, CMA, am acting as scribe for Alm Rhyme, MD .   Documentation: I have reviewed the above documentation for accuracy and completeness, and I agree with the above.  Alm Rhyme, MD

## 2023-12-04 NOTE — Patient Instructions (Signed)

## 2023-12-05 ENCOUNTER — Telehealth: Payer: Self-pay

## 2023-12-05 ENCOUNTER — Encounter: Payer: Self-pay | Admitting: Dermatology

## 2023-12-05 NOTE — Telephone Encounter (Signed)
 Returned patient's call. She states that she has pus filled raised areas where lesions were treated yesterday with LN2. Discussed with patient that blisters can occur after LN2, and to send a photo via MyChart for further evaluation. Patient states that she will send photos when her husband is back at home to take the photo for her.

## 2023-12-10 ENCOUNTER — Ambulatory Visit: Admitting: Physical Therapy

## 2023-12-10 DIAGNOSIS — M546 Pain in thoracic spine: Secondary | ICD-10-CM | POA: Diagnosis not present

## 2023-12-10 DIAGNOSIS — M5459 Other low back pain: Secondary | ICD-10-CM | POA: Diagnosis not present

## 2023-12-10 DIAGNOSIS — G8929 Other chronic pain: Secondary | ICD-10-CM | POA: Diagnosis not present

## 2023-12-10 DIAGNOSIS — M549 Dorsalgia, unspecified: Secondary | ICD-10-CM

## 2023-12-10 NOTE — Therapy (Signed)
 OUTPATIENT PHYSICAL THERAPY THORACOLUMBAR TREATMENT   Patient Name: Kelly James MRN: [id number] DOB:[birth year], 68 y.o., female Today's Date: 12/10/2023  END OF SESSION:  PT End of Session - 12/10/23 1522     Visit Number 2    Number of Visits 24    Date for PT Re-Evaluation 02/25/24    Authorization Type [employer] Medicare 2025    Authorization - Visit Number 2    Authorization - Number of Visits 24    Progress Note Due on Visit 10    PT Start Time 1520    PT Stop Time 1600    PT Time Calculation (min) 40 min    Activity Tolerance Patient tolerated treatment well    Behavior During Therapy [facility] for tasks assessed/performed           Past Medical History:  Diagnosis Date   Atypical mole 09/10/2019   L ant thigh near the knee    Migraines    UTI (urinary tract infection)    Past Surgical History:  Procedure Laterality Date   TUBAL LIGATION  1984   Patient Active Problem List   Diagnosis Date Noted   Chronic midline thoracic back pain 11/28/2023   Fainting spell 01/24/2023   Foot mass, left 11/21/2022   Urinary frequency 06/20/2022   Urinary incontinence 06/20/2022   Acute shoulder pain 11/03/2021   Palpitations 08/16/2021   Other fatigue 07/05/2021   Rash and nonspecific skin eruption 05/02/2021   Elevated blood pressure reading in office without diagnosis of hypertension 05/02/2021   Frequent headaches 10/21/2020   Joint stiffness 03/03/2020   Genital herpes 03/03/2020   Preventative health care 07/17/2019   Hyperlipidemia 07/17/2019   GERD (gastroesophageal reflux disease) 07/17/2019   Vertigo 08/27/2018   Knee pain 05/03/2017    PCP: Comer Gaskins NP  REFERRING PROVIDER: Comer Gaskins NP   REFERRING DIAG: [phone] (ICD-10-CM) - Chronic midline thoracic back pain  Rationale for Evaluation and Treatment: Rehabilitation  THERAPY DIAG:  Other low back pain  Mid back pain  ONSET DATE: Started 1992    SUBJECTIVE:                                                                                                                                                                                            SUBJECTIVE STATEMENT: Pt reports that she did not have any episodes of catching in her low back.      PERTINENT HISTORY:  Pt reports that she started to experiencing pain when gardening, but it is not until recently that started to notice it more. 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 She has increased mid and low back pain when bending forward for long periods of time. She does not experience increased pain in the morning, but just when she is performing baking related tasks and sporadically when raising up from bending forward.   PAIN:  Are you having pain? No  PRECAUTIONS: None  RED FLAGS: None   WEIGHT BEARING RESTRICTIONS: No  FALLS:  Has patient fallen in last 6 months? No  LIVING ENVIRONMENT: Lives with: lives with their family Lives in: House/apartment Stairs: Yes: Internal: 13 steps; on right going up and External: 6 steps; on right going up Has following equipment at home: Single point cane  OCCUPATION: Retired  PLOF: Independent  PATIENT GOALS: Pt wants to experience less pain when working and know how to treat this condition.    OBJECTIVE:  Note: Objective measures were completed at Evaluation unless otherwise noted.  VITALS: NT   DIAGNOSTIC FINDINGS:  NEXT MD VISIT: CLINICAL DATA:  chronic thoracic back pain   EXAM: THORACIC SPINE - 3 VIEWS   COMPARISON:  None Available.   FINDINGS: Eleven rib-bearing thoracic type vertebral bodies. Otherwise, normal alignment with expected thoracic kyphosis. The cervicothoracic junction alignment is normal. Vertebral body heights are well maintained without acute fracture. Mild intervertebral disc height loss involving a couple of midthoracic vertebral bodies with fairly diffuse osteophyte formation throughout the thoracic spine. The  soft tissues are otherwise unremarkable.   IMPRESSION: 1. No acute fracture or malalignment of the thoracic spine. 2. Mild degenerative disc disease within the midthoracic spine.     Electronically Signed   By: Rogelia Myers M.D.   On: 11/28/2023 10:48  PATIENT SURVEYS:   Patient Specific Functional Scale (PSFS):    COGNITION: Overall cognitive status: Within functional limits for tasks assessed     SENSATION: WFL  MUSCLE LENGTH: Hamstrings: Right 90 deg; Left 90 deg Thomas test: 0 degrees bilaterally  Ely's Test: Negative bilaterally   POSTURE: rounded shoulders  PALPATION: No area TTP, increased tension in thoracic paraspinals   LUMBAR ROM:   AROM eval  Flexion 100%  Extension 100%  Right lateral flexion 100%  Left lateral flexion 100%  Right rotation 100%  Left rotation 100%   (Blank rows = not tested)  LOWER EXTREMITY ROM:     Active  Right eval Left eval  Hip flexion    Hip extension    Hip abduction    Hip adduction    Hip internal rotation    Hip external rotation    Knee flexion    Knee extension    Ankle dorsiflexion    Ankle plantarflexion    Ankle inversion    Ankle eversion     (Blank rows = not tested)  LOWER EXTREMITY MMT:    MMT Right eval Left eval  Hip flexion 4 4  Hip extension 4- 4-  Hip abduction 4 4  Hip adduction 4 4  Hip internal rotation 4 4  Hip external rotation 4 4  Knee flexion 4 4  Knee extension 4 4  Ankle dorsiflexion 4 4  Ankle plantarflexion    Ankle inversion    Ankle eversion     (Blank rows = not tested)  PERISCAPULAR STRENGTH                                 Cervical ROM: No radicular symptoms with any cervical movements  Cervical                                AROM                                            Flex    45           45                                           Ext      45           45                       Lat Side  Bend R/L 45/45        45/40                       Rotation       R/L     60/60       60/60      MMT Right eval Left eval  Shoulder flexion    Shoulder extension    Shoulder abduction    Shoulder adduction    Shoulder internal rotation    Shoulder external rotation    Rhomboids      Mid Trap     Elbow flexion    Elbow extension    Wrist flexion    Wrist extension    Wrist ulnar deviation    Wrist radial deviation    Wrist pronation    Wrist supination    Combined ER     Combined IR                                 (Blank rows = not tested)     LUMBAR SPECIAL TESTS:  Straight leg raise test: Negative, FABER test: Negative, Thomas test: Negative, and FADIR Negative   FUNCTIONAL TESTS:  Squats: NT, Single Leg Stance Test: NT    GAIT: No gait analysis performed    TREATMENT DATE:   12/10/23:  JERRE   Lumbar AAROM Flexion Ball Roll Center, Right, and Left  x 30  Lumbar AAROM towel slides center, right, and left  x 30   Modified Bird Dog against wall 2 x 10   -Pt reports increased left sided low back pain  Modified Bird Dog against counter 2 x 10   -Pt reports increased left sided low back pain  Quadruped Hip Extension 2 x 10   -Pt reports increased left sided low back pain   Patient Specific Functional Scale (PSFS): -Washing Dishes for Baking Baseline: 7 -Sitting for long period of time: 9    [place]ann Level 3-Able to perform  Sah[place]ann Level 4-Able to perform  [place]ann Level 5- Able to perform    PATIENT EDUCATION:  Education details: Form and technique for correct performance of exercise and explanation about potential causes for underlying deficits  Person educated: Patient Education method: Programmer, multimedia, Demonstration, Verbal cues, and Handouts Education comprehension: verbalized understanding, returned demonstration, and verbal cues required  HOME EXERCISE PROGRAM: Access Code: [email or web address]  URL: [email or web address] Date: 12/10/2023 Prepared  by: Toribio Servant  Exercises - Supine Lower Trunk Rotation  - 1 x daily - 7 x weekly - 2 sets - 10 reps - 3 sec  hold - Cat Cow  - 1 x daily - 7 x weekly - 2 sets - 10 reps - 5-10 sec  hold - Child's Pose Stretch  - 1 x daily - 7 x weekly - 3 reps - 30-60 sec hold - Supine Bridge  - 3-4 x weekly - 3 sets - 10 reps - Beginner Front Arm Support  - 3-4 x weekly - 3 sets - 10 reps - Supine 90/90 with Leg Extensions  - 3-4 x weekly - 2-3 sets - 10 reps  ASSESSMENT:  CLINICAL IMPRESSION: Pt presents for follow up for initial eval for chronic mid and low back pain. Despite experiencing pain, it is not limiting her from completing most of her daily activities and she only feels little difficulty with completing cleaning while baking and sitting for a long period of time. She was able to tolerate most exercises without experiencing an increase in her pain with the exception of modified bird dogs, which increased her low back pain especially with lumbar extension from flexed position. This exercise was modified to quadruped or flexion based position and patient was able to tolerate position without an increase in her low back pain. She will continue to benefit from skilled PT to address these aforementioned deficits to continue to stand and bend to perform home care and baking tasks without being limited by pain.     OBJECTIVE IMPAIRMENTS: decreased activity tolerance, decreased strength, and pain.   ACTIVITY LIMITATIONS: bending, toileting, and hygiene/grooming  PARTICIPATION LIMITATIONS: meal prep, cleaning, and occupation  PERSONAL FACTORS: Age, Fitness, Time since onset of injury/illness/exacerbation, and 1 comorbidity: HLD are also affecting patient's functional outcome.   REHAB POTENTIAL: Good  CLINICAL DECISION MAKING: Stable/uncomplicated  EVALUATION COMPLEXITY: Low   GOALS: Goals reviewed with patient? No  SHORT TERM GOALS: Target date: 12/17/2023  Patient will demonstrate  undestanding of home exercise plan by performing exercises correctly with evidence of good carry over with min to no verbal or tactile cues .   Baseline: NT 12/10/23: Performing independently   Goal status: ACHIEVED    2.  Patient will demonstrate understanding of correct ergonomic work place setup to avoid straining mid and low back by demonstrating this setup for physical therapist with little to no cueing. Baseline: NT  Goal status: ONGOING     LONG TERM GOALS: Target date: 02/25/2024  Patient will demonstrate an improvement of >=3 pts on the patient specific functional scale (PSFS) as evidence of an in improvement in function that is not due to chance.  Baseline: 7 for baseline with washing dishes,  9 sitting for a long period of time Goal status: ONGOING  2.  Patient will improve hip strength >=1/3 grade MMT (ie 4- to 4) and abdominal strength by >=1 grade for improved spinal stability and low back function. Baseline: Hip Ext R/L 4-/4- , Hip Flex R/L 4/4,  Goal status: ONGOING    3.  Patient will improve shoulder and periscapular strength by >=1/3 grade MMT (Iie 4- to 4) for improved thoracic stability and function to return to maintaining standing forward trunk flexion positions without pain and discomfort to perform home care and baking tasks without being limited by pain and discomfort.   Baseline: NT   Goal status: ONGOING     PLAN:  PT FREQUENCY: 1-2x/week  PT DURATION: 12 weeks  PLANNED INTERVENTIONS: 97164- PT Re-evaluation, 97750- Physical Performance Testing, 97110-Therapeutic exercises, 97530- Therapeutic activity, [id number]- Neuromuscular re-education, 97535- Self Care, 02859- Manual therapy, [phone]- Gait training, [phone]- Canalith repositioning, [id number]- Aquatic Therapy, [phone]- Electrical stimulation (unattended), [phone]- Electrical stimulation (manual), [id number]- Traction (mechanical), 20560 (1-2 muscles), 20561 (3+ muscles)- Dry Needling, Patient/Family education, Balance  training, Stair training, Taping, Joint mobilization, Joint manipulation, Spinal manipulation, Spinal mobilization, Vestibular training, DME instructions, Cryotherapy, and Moist heat.  PLAN FOR NEXT SESSION: Check vitals. Test Periscapular and shoulder strength. Progress bird dog to include upper extremities. Manual to reduce thoracic paraspinal tension. Check in on work place setup.    Toribio Servant PT, DPT  [facility] [address] [place], [address] Phone: [phone]   Fax:  [phone]

## 2023-12-12 ENCOUNTER — Encounter: Admitting: Physical Therapy

## 2023-12-17 ENCOUNTER — Ambulatory Visit: Admitting: Physical Therapy

## 2023-12-17 DIAGNOSIS — M549 Dorsalgia, unspecified: Secondary | ICD-10-CM | POA: Diagnosis not present

## 2023-12-17 DIAGNOSIS — M5459 Other low back pain: Secondary | ICD-10-CM

## 2023-12-17 DIAGNOSIS — G8929 Other chronic pain: Secondary | ICD-10-CM | POA: Diagnosis not present

## 2023-12-17 DIAGNOSIS — M546 Pain in thoracic spine: Secondary | ICD-10-CM | POA: Diagnosis not present

## 2023-12-17 NOTE — Therapy (Signed)
 OUTPATIENT PHYSICAL THERAPY THORACOLUMBAR TREATMENT   Patient Name: Kelly James MRN: [id number] DOB:01/14/1956, 68 y.o., female Today's Date: 12/17/2023  END OF SESSION:  PT End of Session - 12/17/23 1120     Visit Number 3    Number of Visits 24    Date for PT Re-Evaluation 02/25/24    Authorization Type [employer] Medicare 2025    Authorization - Visit Number 3    Authorization - Number of Visits 24    Progress Note Due on Visit 10    PT Start Time 1115    PT Stop Time 1200    PT Time Calculation (min) 45 min    Activity Tolerance Patient tolerated treatment well    Behavior During Therapy [place] for tasks assessed/performed            Past Medical History:  Diagnosis Date   Atypical mole 09/10/2019   L ant thigh near the knee    Migraines    UTI (urinary tract infection)    Past Surgical History:  Procedure Laterality Date   TUBAL LIGATION  1984   Patient Active Problem List   Diagnosis Date Noted   Chronic midline thoracic back pain 11/28/2023   Fainting spell 01/24/2023   Foot mass, left 11/21/2022   Urinary frequency 06/20/2022   Urinary incontinence 06/20/2022   Acute shoulder pain 11/03/2021   Palpitations 08/16/2021   Other fatigue 07/05/2021   Rash and nonspecific skin eruption 05/02/2021   Elevated blood pressure reading in office without diagnosis of hypertension 05/02/2021   Frequent headaches 10/21/2020   Joint stiffness 03/03/2020   Genital herpes 03/03/2020   Preventative health care 07/17/2019   Hyperlipidemia 07/17/2019   GERD (gastroesophageal reflux disease) 07/17/2019   Vertigo 08/27/2018   Knee pain 05/03/2017    PCP: Comer Gaskins NP  REFERRING PROVIDER: Comer Gaskins NP   REFERRING DIAG: [phone] (ICD-10-CM) - Chronic midline thoracic back pain  Rationale for Evaluation and Treatment: Rehabilitation  THERAPY DIAG:  No diagnosis found.  ONSET DATE: Started 1992    SUBJECTIVE:                                                                                                                                                                                            SUBJECTIVE STATEMENT: Pt states that she has had little to no episodes of back pain since last session. 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      PERTINENT HISTORY:  Pt reports that she started to experiencing pain when gardening, but it is not until recently that started to notice it more. 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 She has increased mid and low back pain when bending forward for long periods of time. She does not experience increased pain in the morning, but just when she is performing baking related tasks and sporadically when raising up from bending forward.   PAIN:  Are you having pain? No  PRECAUTIONS: None  RED FLAGS: None   WEIGHT BEARING RESTRICTIONS: No  FALLS:  Has patient fallen in last 6 months? No  LIVING ENVIRONMENT: Lives with: lives with their family Lives in: House/apartment Stairs: Yes: Internal: 13 steps; on right going up and External: 6 steps; on right going up Has following equipment at home: Single point cane  OCCUPATION: Retired  PLOF: Independent  PATIENT GOALS: Pt wants to experience less pain when working and know how to treat this condition.    OBJECTIVE:  Note: Objective measures were completed at Evaluation unless otherwise noted.  VITALS: NT   DIAGNOSTIC FINDINGS:  NEXT MD VISIT: CLINICAL DATA:  chronic thoracic back pain   EXAM: THORACIC SPINE - 3 VIEWS   COMPARISON:  None Available.   FINDINGS: Eleven rib-bearing thoracic type vertebral bodies. Otherwise, normal alignment with expected thoracic kyphosis. The cervicothoracic junction alignment is normal. Vertebral body heights are well maintained without acute fracture. Mild intervertebral disc height loss involving a couple of midthoracic vertebral bodies with fairly diffuse  osteophyte formation throughout the thoracic spine. The soft tissues are otherwise unremarkable.   IMPRESSION: 1. No acute fracture or malalignment of the thoracic spine. 2. Mild degenerative disc disease within the midthoracic spine.     Electronically Signed   By: Rogelia Myers M.D.   On: 11/28/2023 10:48  PATIENT SURVEYS:   Patient Specific Functional Scale (PSFS):    COGNITION: Overall cognitive status: Within functional limits for tasks assessed     SENSATION: WFL  MUSCLE LENGTH: Hamstrings: Right 90 deg; Left 90 deg Thomas test: 0 degrees bilaterally  Ely's Test: Negative bilaterally   POSTURE: rounded shoulders  PALPATION: No area TTP, increased tension in thoracic paraspinals   LUMBAR ROM:   AROM eval  Flexion 100%  Extension 100%  Right lateral flexion 100%  Left lateral flexion 100%  Right rotation 100%  Left rotation 100%   (Blank rows = not tested)  LOWER EXTREMITY ROM:     Active  Right eval Left eval  Hip flexion    Hip extension    Hip abduction    Hip adduction    Hip internal rotation    Hip external rotation    Knee flexion    Knee extension    Ankle dorsiflexion    Ankle plantarflexion    Ankle inversion    Ankle eversion     (Blank rows = not tested)  LOWER EXTREMITY MMT:    MMT Right eval Left eval  Hip flexion 4 4  Hip extension 4- 4-  Hip abduction 4 4  Hip adduction 4 4  Hip internal rotation 4 4  Hip external rotation 4 4  Knee flexion 4 4  Knee extension 4 4  Ankle dorsiflexion 4 4  Ankle plantarflexion    Ankle inversion    Ankle eversion     (Blank rows = not tested)  PERISCAPULAR STRENGTH                                 Cervical ROM: No radicular symptoms with  any cervical movements                                                                 Cervical                                AROM                                            Flex    45           45                                            Ext      45           45                       Lat Side Bend R/L 45/45        45/40                       Rotation       R/L     60/60       60/60      MMT Right Eval (12/17/23) Left Eval (12/17/23)  Shoulder flexion    Shoulder extension 4 4  Shoulder abduction    Shoulder adduction    Shoulder internal rotation    Shoulder external rotation    Rhomboids      Mid Trap  4 4  Lower Trap   4 4-  Elbow flexion    Elbow extension    Wrist flexion    Wrist extension    Wrist ulnar deviation    Wrist radial deviation    Wrist pronation    Wrist supination    Combined ER     Combined IR                                 (Blank rows = not tested)     LUMBAR SPECIAL TESTS:  Straight leg raise test: Negative, FABER test: Negative, Thomas test: Negative, and FADIR Negative   FUNCTIONAL TESTS:  Squats: NT, Single Leg Stance Test: NT    GAIT: No gait analysis performed    TREATMENT DATE:   12/17/23: THEREX   Nu-Step with seat at 6 and arms at 8 for 5 min with resistance at 3 Periscapular MMT (See Chart Below)  OMEGA Seated Rows #35 3 x 10   -min VC to decrease amount of extension   Bird Dog 2 x 10  -modified by placing pillow under right knee   Reviewed upper body exercises from Dr. Watt: -Focus on rotator cuff and bicep's strengthening   D2 Shoulder PNF Flexion/Extension with BUE 3 x 10  -min VC to reduce shoulder extension and ER to avoid anterior shoulder pain   Work place setup:  40 desk height for most baking.      PATIENT EDUCATION:  Education details: Form and technique for correct performance of exercise and explanation about potential causes for underlying deficits  Person educated: Patient Education method: Programmer, multimedia, Demonstration, Verbal cues, and Handouts Education comprehension: verbalized understanding, returned demonstration, and verbal cues required  HOME EXERCISE PROGRAM: Access Code: [email or web address] URL: [email or web address] Date:  12/17/2023 Prepared by: Toribio Servant  Exercises - Seated 3 Way Exercise Ball Roll Out Stretch  - 1 x daily - 7 x weekly - 3 sets - 10 reps - Supine Lower Trunk Rotation  - 1 x daily - 7 x weekly - 2 sets - 10 reps - 3 sec  hold - Cat Cow  - 1 x daily - 7 x weekly - 2 sets - 10 reps - 5-10 sec  hold - Child's Pose Stretch  - 1 x daily - 7 x weekly - 3 reps - 30-60 sec hold - Supine 90/90 with Leg Extensions  - 3-4 x weekly - 2-3 sets - 10 reps - Bird Dog  - 3-4 x weekly - 3 sets - 10 reps - Shoulder PNF D2 Flexion  - 3-4 x weekly - 3 sets - 10 reps  ASSESSMENT:  CLINICAL IMPRESSION: Pt shows ongoing improvement with hip and periscapular strength with ability to perform progressed exercises. Modified some of the exercises to avoid exacerbation of shoulder pain with decreased shoulder extension and external rotation, which pt reported decreased shoulder pain. Pt did not report an incidence of increase low back pain during exercises.  She will continue to benefit from skilled PT to address these aforementioned deficits to continue to stand and bend to perform home care and baking tasks without being limited by pain.    OBJECTIVE IMPAIRMENTS: decreased activity tolerance, decreased strength, and pain.   ACTIVITY LIMITATIONS: bending, toileting, and hygiene/grooming  PARTICIPATION LIMITATIONS: meal prep, cleaning, and occupation  PERSONAL FACTORS: Age, Fitness, Time since onset of injury/illness/exacerbation, and 1 comorbidity: HLD are also affecting patient's functional outcome.   REHAB POTENTIAL: Good  CLINICAL DECISION MAKING: Stable/uncomplicated  EVALUATION COMPLEXITY: Low   GOALS: Goals reviewed with patient? No  SHORT TERM GOALS: Target date: 12/17/2023  Patient will demonstrate undestanding of home exercise plan by performing exercises correctly with evidence of good carry over with min to no verbal or tactile cues .   Baseline: NT 12/10/23: Performing independently   Goal  status: ACHIEVED    2.  Patient will demonstrate understanding of correct ergonomic work place setup to avoid straining mid and low back by demonstrating this setup for physical therapist with little to no cueing. Baseline: NT 12/17/23: Pt's activities require some rounding of shoulders, but further analysis needs to be completed Goal status: ACHIEVED      LONG TERM GOALS: Target date: 02/25/2024  Patient will demonstrate an improvement of >=3 pts on the patient specific functional scale (PSFS) as evidence of an in improvement in function that is not due to chance.  Baseline: 7 for baseline with washing dishes,  9 sitting for a long period of time Goal status: ONGOING  2.  Patient will improve hip strength >=1/3 grade MMT (ie 4- to 4) and abdominal strength by >=1 grade for improved spinal stability and low back function. Baseline: Hip Ext R/L 4-/4- , Hip Flex R/L 4/4,  Goal status: ONGOING    3.  Patient will improve shoulder and periscapular strength by >=1/3 grade MMT (Iie 4- to 4) for improved thoracic stability  and function to return to maintaining standing forward trunk flexion positions without pain and discomfort to perform home care and baking tasks without being limited by pain and discomfort.   Baseline: Lower Trap R/L 4/4-, Mid Trap R/L 4/4, Shoulder Extension R/L 4/4   Goal status: ONGOING     PLAN:  PT FREQUENCY: 1-2x/week  PT DURATION: 12 weeks  PLANNED INTERVENTIONS: 97164- PT Re-evaluation, 97750- Physical Performance Testing, 97110-Therapeutic exercises, 97530- Therapeutic activity, 97112- Neuromuscular re-education, 97535- Self Care, 02859- Manual therapy, [id number]- Gait training, [phone]- Canalith repositioning, [id number]- Aquatic Therapy, [phone]- Electrical stimulation (unattended), [phone]- Electrical stimulation (manual), [id number]- Traction (mechanical), 20560 (1-2 muscles), 20561 (3+ muscles)- Dry Needling, Patient/Family education, Balance training, Stair training, Taping, Joint  mobilization, Joint manipulation, Spinal manipulation, Spinal mobilization, Vestibular training, DME instructions, Cryotherapy, and Moist heat.  PLAN FOR NEXT SESSION: Analysis of work place setup. Progress bird dog to include upper extremities. OMEGA Lat Pull Down.  Manual to reduce thoracic paraspinal tension.   Toribio Servant PT, DPT  [facility] [address] [place], [address] Phone: [phone]   Fax:  [phone]

## 2023-12-19 ENCOUNTER — Encounter: Admitting: Physical Therapy

## 2023-12-24 ENCOUNTER — Ambulatory Visit: Admitting: Physical Therapy

## 2023-12-24 DIAGNOSIS — M546 Pain in thoracic spine: Secondary | ICD-10-CM | POA: Diagnosis not present

## 2023-12-24 DIAGNOSIS — G8929 Other chronic pain: Secondary | ICD-10-CM | POA: Diagnosis not present

## 2023-12-24 DIAGNOSIS — M5459 Other low back pain: Secondary | ICD-10-CM | POA: Diagnosis not present

## 2023-12-24 DIAGNOSIS — M549 Dorsalgia, unspecified: Secondary | ICD-10-CM

## 2023-12-24 NOTE — Therapy (Signed)
 OUTPATIENT PHYSICAL THERAPY THORACOLUMBAR TREATMENT   Patient Name: Kelly James MRN: [id number] DOB:November 14, 1955, 68 y.o., female Today's Date: 12/24/2023  END OF SESSION:  PT End of Session - 12/24/23 1122     Visit Number 4    Number of Visits 24    Date for PT Re-Evaluation 02/25/24    Authorization Type [employer] 2025    Authorization - Visit Number 4    Authorization - Number of Visits 24    Progress Note Due on Visit 10    PT Start Time 1120    PT Stop Time 1200    PT Time Calculation (min) 40 min    Activity Tolerance Patient tolerated treatment well    Behavior During Therapy [facility] for tasks assessed/performed            Past Medical History:  Diagnosis Date   Atypical mole 09/10/2019   L ant thigh near the knee    Migraines    UTI (urinary tract infection)    Past Surgical History:  Procedure Laterality Date   TUBAL LIGATION  1984   Patient Active Problem List   Diagnosis Date Noted   Chronic midline thoracic back pain 11/28/2023   Fainting spell 01/24/2023   Foot mass, left 11/21/2022   Urinary frequency 06/20/2022   Urinary incontinence 06/20/2022   Acute shoulder pain 11/03/2021   Palpitations 08/16/2021   Other fatigue 07/05/2021   Rash and nonspecific skin eruption 05/02/2021   Elevated blood pressure reading in office without diagnosis of hypertension 05/02/2021   Frequent headaches 10/21/2020   Joint stiffness 03/03/2020   Genital herpes 03/03/2020   Preventative health care 07/17/2019   Hyperlipidemia 07/17/2019   GERD (gastroesophageal reflux disease) 07/17/2019   Vertigo 08/27/2018   Knee pain 05/03/2017    PCP: Comer Gaskins NP  REFERRING PROVIDER: Comer Gaskins NP   REFERRING DIAG: [phone] (ICD-10-CM) - Chronic midline thoracic back pain  Rationale for Evaluation and Treatment: Rehabilitation  THERAPY DIAG:  Other low back pain  Mid back pain  ONSET DATE: Started 1992    SUBJECTIVE:                                                                                                                                                                                            SUBJECTIVE STATEMENT: Pt reports being very busy lately and she has not had any notable incidents of low back pain.      PERTINENT HISTORY:  Pt reports that she started to experiencing pain when gardening, but it is not until recently that started to notice it more. 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 She has increased mid and low back pain when bending forward for long periods of time. She does not experience increased pain in the morning, but just when she is performing baking related tasks and sporadically when raising up from bending forward.   PAIN:  Are you having pain? No  PRECAUTIONS: None  RED FLAGS: None   WEIGHT BEARING RESTRICTIONS: No  FALLS:  Has patient fallen in last 6 months? No  LIVING ENVIRONMENT: Lives with: lives with their family Lives in: House/apartment Stairs: Yes: Internal: 13 steps; on right going up and External: 6 steps; on right going up Has following equipment at home: Single point cane  OCCUPATION: Retired  PLOF: Independent  PATIENT GOALS: Pt wants to experience less pain when working and know how to treat this condition.    OBJECTIVE:  Note: Objective measures were completed at Evaluation unless otherwise noted.  VITALS: NT   DIAGNOSTIC FINDINGS:  NEXT MD VISIT: CLINICAL DATA:  chronic thoracic back pain   EXAM: THORACIC SPINE - 3 VIEWS   COMPARISON:  None Available.   FINDINGS: Eleven rib-bearing thoracic type vertebral bodies. Otherwise, normal alignment with expected thoracic kyphosis. The cervicothoracic junction alignment is normal. Vertebral body heights are well maintained without acute fracture. Mild intervertebral disc height loss involving a couple of midthoracic vertebral bodies with fairly diffuse osteophyte formation throughout  the thoracic spine. The soft tissues are otherwise unremarkable.   IMPRESSION: 1. No acute fracture or malalignment of the thoracic spine. 2. Mild degenerative disc disease within the midthoracic spine.     Electronically Signed   By: Rogelia Myers M.D.   On: 11/28/2023 10:48  PATIENT SURVEYS:   Patient Specific Functional Scale (PSFS):    COGNITION: Overall cognitive status: Within functional limits for tasks assessed     SENSATION: WFL  MUSCLE LENGTH: Hamstrings: Right 90 deg; Left 90 deg Thomas test: 0 degrees bilaterally  Ely's Test: Negative bilaterally   POSTURE: rounded shoulders  PALPATION: No area TTP, increased tension in thoracic paraspinals   LUMBAR ROM:   AROM eval  Flexion 100%  Extension 100%  Right lateral flexion 100%  Left lateral flexion 100%  Right rotation 100%  Left rotation 100%   (Blank rows = not tested)  LOWER EXTREMITY ROM:     Active  Right eval Left eval  Hip flexion    Hip extension    Hip abduction    Hip adduction    Hip internal rotation    Hip external rotation    Knee flexion    Knee extension    Ankle dorsiflexion    Ankle plantarflexion    Ankle inversion    Ankle eversion     (Blank rows = not tested)  LOWER EXTREMITY MMT:    MMT Right eval Left eval  Hip flexion 4 4  Hip extension 4- 4-  Hip abduction 4 4  Hip adduction 4 4  Hip internal rotation 4 4  Hip external rotation 4 4  Knee flexion 4 4  Knee extension 4 4  Ankle dorsiflexion 4 4  Ankle plantarflexion    Ankle inversion    Ankle eversion     (Blank rows = not tested)  PERISCAPULAR STRENGTH                                 Cervical ROM: No radicular symptoms with any cervical movements  Cervical                                AROM                                            Flex    45           45                                           Ext      45           45                        Lat Side Bend R/L 45/45        45/40                       Rotation       R/L     60/60       60/60      MMT Right Eval (12/17/23) Left Eval (12/17/23)  Shoulder flexion    Shoulder extension 4 4  Shoulder abduction    Shoulder adduction    Shoulder internal rotation    Shoulder external rotation    Rhomboids      Mid Trap  4 4  Lower Trap   4 4-  Elbow flexion    Elbow extension    Wrist flexion    Wrist extension    Wrist ulnar deviation    Wrist radial deviation    Wrist pronation    Wrist supination    Combined ER     Combined IR                                 (Blank rows = not tested)     LUMBAR SPECIAL TESTS:  Straight leg raise test: Negative, FABER test: Negative, Thomas test: Negative, and FADIR Negative   FUNCTIONAL TESTS:  Squats: NT, Single Leg Stance Test: NT    GAIT: No gait analysis performed    TREATMENT DATE:   12/24/23: THEREX LTR with 3 sec hold 2 x 10   Single Knee to Chest with 3 sec hold 2 x 10   Supine 90/90 alternating  heel taps  Wall Sit against wall    Wall Sit against wall with isometric shoulder flexion at 90 deg with #4 DB 30 sec  Modified plank on forearms with forward lean 30 sec   OMEGA Palloff Press #5 2 x 10  Palloff Press with blue TB 2 x 10   Determining size of exercise ball so patient could order: 79 inch/3.14= 65 cm diameter ball      PATIENT EDUCATION:  Education details: Form and technique for correct performance of exercise and explanation about potential causes for underlying deficits  Person educated: Patient Education method: Programmer, multimedia, Demonstration, Verbal cues, and Handouts Education comprehension: verbalized understanding, returned demonstration, and verbal cues required  HOME EXERCISE PROGRAM: Access Code: [email or web address] URL: [email or web address] Date: 12/24/2023 Prepared by: Toribio Servant  Exercises - Seated 3 Way Exercise  EMCOR Stretch  - 1 x daily - 7 x weekly - 3  sets - 10 reps - Supine Lower Trunk Rotation  - 1 x daily - 7 x weekly - 2 sets - 10 reps - 3 sec  hold - Cat Cow  - 1 x daily - 7 x weekly - 2 sets - 10 reps - 5-10 sec  hold - Child's Pose Stretch  - 1 x daily - 7 x weekly - 3 reps - 30-60 sec hold - Bird Dog  - 3-4 x weekly - 3 sets - 10 reps - Standing Anti-Rotation Press with Anchored Resistance  - 3-4 x weekly - 3 sets - 10 reps - Shoulder Bent over Y's   - 3-4 x weekly - 3 sets - 10 reps  ASSESSMENT:  CLINICAL IMPRESSION: Pt continues to tolerate exercise session without an increase in back pain. Modified one exercise that induced pain with include standing abdominal strengthening exercise, which patient was able to perform without pain. She will continue to benefit from skilled PT to address these aforementioned deficits to continue to stand and bend to perform home care and baking tasks without being limited by pain.   OBJECTIVE IMPAIRMENTS: decreased activity tolerance, decreased strength, and pain.   ACTIVITY LIMITATIONS: bending, toileting, and hygiene/grooming  PARTICIPATION LIMITATIONS: meal prep, cleaning, and occupation  PERSONAL FACTORS: Age, Fitness, Time since onset of injury/illness/exacerbation, and 1 comorbidity: HLD are also affecting patient's functional outcome.   REHAB POTENTIAL: Good  CLINICAL DECISION MAKING: Stable/uncomplicated  EVALUATION COMPLEXITY: Low   GOALS: Goals reviewed with patient? No  SHORT TERM GOALS: Target date: 12/17/2023  Patient will demonstrate undestanding of home exercise plan by performing exercises correctly with evidence of good carry over with min to no verbal or tactile cues .   Baseline: NT 12/10/23: Performing independently   Goal status: ACHIEVED    2.  Patient will demonstrate understanding of correct ergonomic work place setup to avoid straining mid and low back by demonstrating this setup for physical therapist with little to no cueing. Baseline: NT 12/17/23: Pt's  activities require some rounding of shoulders, but further analysis needs to be completed Goal status: ACHIEVED      LONG TERM GOALS: Target date: 02/25/2024  Patient will demonstrate an improvement of >=3 pts on the patient specific functional scale (PSFS) as evidence of an in improvement in function that is not due to chance.  Baseline: 7 for baseline with washing dishes,  9 sitting for a long period of time Goal status: ONGOING  2.  Patient will improve hip strength >=1/3 grade MMT (ie 4- to 4) and abdominal strength by >=1 grade for improved spinal stability and low back function. Baseline: Hip Ext R/L 4-/4- , Hip Flex R/L 4/4,  Goal status: ONGOING    3.  Patient will improve shoulder and periscapular strength by >=1/3 grade MMT (Iie 4- to 4) for improved thoracic stability and function to return to maintaining standing forward trunk flexion positions without pain and discomfort to perform home care and baking tasks without being limited by pain and discomfort.   Baseline: Lower Trap R/L 4/4-, Mid Trap R/L 4/4, Shoulder Extension R/L 4/4   Goal status: ONGOING     PLAN:  PT FREQUENCY: 1-2x/week  PT DURATION: 12 weeks  PLANNED INTERVENTIONS: 97164- PT Re-evaluation, 97750- Physical Performance Testing, 97110-Therapeutic exercises, 97530- Therapeutic activity, [id number]- Neuromuscular re-education, 97535- Self Care, 02859- Manual therapy, [id number]- Gait training, [phone]- Canalith repositioning, [id number]- Aquatic Therapy,  H9716- Electrical stimulation (unattended), [id number]- Electrical stimulation (manual), 02987- Traction (mechanical), 79439 (1-2 muscles), 20561 (3+ muscles)- Dry Needling, Patient/Family education, Balance training, Stair training, Taping, Joint mobilization, Joint manipulation, Spinal manipulation, Spinal mobilization, Vestibular training, DME instructions, Cryotherapy, and Moist heat.  PLAN FOR NEXT SESSION: Reassess goals. Progress bird dog to include upper extremities. OMEGA Lat  Pull Down.  Manual to reduce thoracic paraspinal tension.   Toribio Servant PT, DPT  [facility] [address] [place], [address] Phone: [phone]   Fax:  [phone]

## 2023-12-26 ENCOUNTER — Encounter: Admitting: Physical Therapy

## 2023-12-30 ENCOUNTER — Ambulatory Visit: Admitting: Physical Therapy

## 2023-12-30 ENCOUNTER — Encounter: Admitting: Physical Therapy

## 2023-12-30 ENCOUNTER — Ambulatory Visit (INDEPENDENT_AMBULATORY_CARE_PROVIDER_SITE_OTHER): Payer: Medicare Other

## 2023-12-30 ENCOUNTER — Ambulatory Visit: Payer: Self-pay

## 2023-12-30 VITALS — BP 118/62 | Ht 60.0 in | Wt 135.0 lb

## 2023-12-30 DIAGNOSIS — G8929 Other chronic pain: Secondary | ICD-10-CM | POA: Diagnosis not present

## 2023-12-30 DIAGNOSIS — Z2821 Immunization not carried out because of patient refusal: Secondary | ICD-10-CM | POA: Diagnosis not present

## 2023-12-30 DIAGNOSIS — M5459 Other low back pain: Secondary | ICD-10-CM

## 2023-12-30 DIAGNOSIS — M549 Dorsalgia, unspecified: Secondary | ICD-10-CM

## 2023-12-30 DIAGNOSIS — Z5320 Procedure and treatment not carried out because of patient's decision for unspecified reasons: Secondary | ICD-10-CM

## 2023-12-30 DIAGNOSIS — Z0000 Encounter for general adult medical examination without abnormal findings: Secondary | ICD-10-CM

## 2023-12-30 DIAGNOSIS — M546 Pain in thoracic spine: Secondary | ICD-10-CM | POA: Diagnosis not present

## 2023-12-30 NOTE — Telephone Encounter (Signed)
 FYI Only or Action Required?: FYI only for provider.  Patient was last seen in primary care on 11/28/2023 by Gretta Comer POUR, NP.  Called Nurse Triage reporting Neck Swelling.  Symptoms began several days ago.  Interventions attempted: Nothing.  Symptoms are: unchanged.  Triage Disposition: See PCP When Office is Open (Within 3 Days)  Patient/caregiver understands and will follow disposition?:      Copied from CRM #[id number]. Topic: Clinical - Red Word Triage >> Dec 30, 2023  5:14 PM [place] J wrote: [facility] that prompted transfer to Nurse Triage: Right side of neck is swollen but there is no pain.      Reason for Disposition  Nursing judgment or information in reference  Answer Assessment - Initial Assessment Questions Patient denies any difficulty swallowing, difficulty breathing, or pain. Patient will cal back for any new or worsening symptoms.      1. REASON FOR CALL: What is your main concern right now?     Neck swelling to the right sided neck  2. ONSET: When did the swelling start?     Noticed a couple of days ago 3. SEVERITY: How bad is the swelling?     Mild 4. FUNCTIONAL IMPAIRMENT: How have things been going for you overall? Have you had more difficulty than usual doing your normal daily activities? (e.g., self-care, school, work, interactions)     No 6. FEVER: Do you have a fever?     No 7. OTHER SYMPTOMS: Do you have any other new symptoms?     No  Protocols used: No Guideline Available-A-AH

## 2023-12-30 NOTE — Progress Notes (Signed)
 Because this visit was a virtual/telehealth visit,  certain criteria was not obtained, such a blood pressure, CBG if applicable, and timed get up and go. Any medications not marked as taking were not mentioned during the medication reconciliation part of the visit. Any vitals not documented were not able to be obtained due to this being a telehealth visit or patient was unable to self-report a recent blood pressure reading due to a lack of equipment at home via telehealth. Vitals that have been documented are verbally provided by the patient.  This visit was performed by a medical professional under my direct supervision. I was immediately available for consultation/collaboration. I have reviewed and agree with the Annual Wellness Visit documentation.  Subjective:   Kelly James is a 68 y.o. who presents for a Medicare Wellness preventive visit.  As a reminder, Annual Wellness Visits don't include a physical exam, and some assessments may be limited, especially if this visit is performed virtually. We may recommend an in-person follow-up visit with your provider if needed.  Visit Complete: Virtual I connected with  Kelly James on 12/30/23 by a audio enabled telemedicine application and verified that I am speaking with the correct person using two identifiers.  Patient Location: Home  Provider Location: Home Office  I discussed the limitations of evaluation and management by telemedicine. The patient expressed understanding and agreed to proceed.  Vital Signs: Because this visit was a virtual/telehealth visit, some criteria may be missing or patient reported. Any vitals not documented were not able to be obtained and vitals that have been documented are patient reported.  VideoDeclined- This patient declined Librarian, academic. Therefore the visit was completed with audio only.  Persons Participating in Visit: Patient.  AWV Questionnaire: Yes: Patient Medicare  AWV questionnaire was completed by the patient on 12/29/2023; I have confirmed that all information answered by patient is correct and no changes since this date.  Cardiac Risk Factors include: advanced age (>55men, >43 women);dyslipidemia     Objective:    Today's Vitals   12/30/23 0955  BP: 118/62  Weight: 135 lb (61.2 kg)  Height: 5' (1.524 m)   Body mass index is 26.37 kg/m.     12/30/2023    9:54 AM 12/25/2022    9:09 AM 01/29/2022    2:23 PM 12/21/2021   12:21 PM  Advanced Directives  Does Patient Have a Medical Advance Directive? Yes Yes Yes Yes  Type of Estate agent of [place];Living will Healthcare Power of [place];Living will Healthcare Power of [employer] of [place];Living will  Does patient want to make changes to medical advance directive? No - Patient declined  No - Patient declined   Copy of Healthcare Power of Attorney in Chart? No - copy requested No - copy requested  No - copy requested    Current Medications (verified) Outpatient Encounter Medications as of 12/30/2023  Medication Sig   acyclovir (ZOVIRAX) 400 MG tablet Take 400 mg by mouth 2 (two) times daily.   ALFALFA PO Take by mouth.   Ascorbic Acid (CHEWABLE VITAMIN C PO) Take 100 mg by mouth.   Ascorbic Acid (VITA-C PO)    B Complex-Biotin-FA (B COMPLETE PO) Take by mouth.   CALCIUM PO Take 1,560 mg by mouth daily.   Cholecalciferol (D3-1000 PO) Take 1 tablet by mouth daily. Vitamin D   Misc Natural Products (JOINT SUPPORT COMPLEX PO) Take by mouth.   Nutritional Supplements (NUTRITIONAL SUPPLEMENT PO) Take by mouth. Stress Relief  Complex-One tablet daily   Nutritional Supplements (NUTRITIONAL SUPPLEMENT PO) Shaklee Collagen 9-2 scoops daily   Nutritional Supplements (NUTRITIONAL SUPPLEMENT PO) Take by mouth. Shaklee Cholesterol Reduction - 2 tablets by mouth twice a day   Nutritional Supplements (NUTRITIONAL SUPPLEMENT PO) Take by mouth. Shaklee Co Q Heart-1  capsule by mouth daily   Nutritional Supplements (SOY PROTEIN SHAKE) POWD Take by mouth.   Omega-3 Fatty Acids (FISH OIL) 1000 MG CAPS Take by mouth.   OVER THE COUNTER MEDICATION Optiflora Prebiotic & Probiotic System-1 tablet by mouth every morning   Propylene Glycol (SYSTANE BALANCE OP) Apply to eye.   TART CHERRY PO Take 3,600 mg by mouth daily.   TURMERIC PO Take by mouth.   VITAMIN E PO Take by mouth.   Zinc Sulfate (ZINC 15 PO) Take by mouth.   UNABLE TO FIND Med Name: CarotoMax and Eye Taking 1 capsule by mouth after breakfast daily (Patient not taking: Reported on 12/30/2023)   UNABLE TO FIND Herb-Lax Taking 1 tablet by mouth after breakfast daily (Patient not taking: Reported on 12/30/2023)   UNABLE TO FIND Med Name: Mind Work (Patient not taking: Reported on 12/30/2023)   No facility-administered encounter medications on file as of 12/30/2023.    Allergies (verified) Codeine and Sulfa antibiotics   History: Past Medical History:  Diagnosis Date   Atypical mole 09/10/2019   L ant thigh near the knee    Migraines    UTI (urinary tract infection)    Past Surgical History:  Procedure Laterality Date   TUBAL LIGATION  1984   Family History  Problem Relation Age of Onset   Arthritis Mother    Alzheimer's disease Mother    Alcohol abuse Father    Asthma Sister    Asthma Brother    Social History   Socioeconomic History   Marital status: Married    Spouse name: Not on file   Number of children: Not on file   Years of education: Not on file   Highest education level: 12th grade  Occupational History   Not on file  Tobacco Use   Smoking status: Never   Smokeless tobacco: Never  Vaping Use   Vaping status: Never Used  Substance and Sexual Activity   Alcohol use: No   Drug use: No   Sexual activity: Not on file  Other Topics Concern   Not on file  Social History Narrative   Married.   2 children, 2 grandchildren.   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    Enjoys baking.   Social Drivers of Corporate investment banker Strain: Low Risk  (12/30/2023)   Overall Financial Resource Strain (CARDIA)    Difficulty of Paying Living Expenses: Not hard at all  Food Insecurity: No Food Insecurity (12/30/2023)   Hunger Vital Sign    Worried About Running Out of Food in the Last Year: Never true    Ran Out of Food in the Last Year: Never true  Transportation Needs: No Transportation Needs (12/30/2023)   PRAPARE - Administrator, Civil Service (Medical): No    Lack of Transportation (Non-Medical): No  Physical Activity: Sufficiently Active (12/30/2023)   Exercise Vital Sign    Days of Exercise per Week: 5 days    Minutes of Exercise per Session: 30 min  Stress: Patient Declined (12/30/2023)   [employer] of Occupational Health - Occupational Stress Questionnaire    Feeling of Stress: Patient declined  Social Connections: Moderately Isolated (  12/30/2023)   Social Connection and Isolation Panel    Frequency of Communication with Friends and Family: More than three times a week    Frequency of Social Gatherings with Friends and Family: More than three times a week    Attends Religious Services: Never    Database administrator or Organizations: No    Attends Engineer, structural: Never    Marital Status: Married    Tobacco Counseling Counseling given: Not Answered    Clinical Intake:  Pre-visit preparation completed: Yes  Pain : No/denies pain     BMI - recorded: 26.37 Nutritional Status: BMI 25 -29 Overweight Nutritional Risks: None Diabetes: No  No results found for: HGBA1C   How often do you need to have someone help you when you read instructions, pamphlets, or other written materials from your doctor or pharmacy?: 1 - Never  Interpreter Needed?: No  Information entered by :: Maysen Bonsignore,cma   Activities of Daily Living     12/29/2023    5:06 PM  In your present state of health, do you have any  difficulty performing the following activities:  Hearing? 0  Vision? 0  Difficulty concentrating or making decisions? 0  Walking or climbing stairs? 0  Dressing or bathing? 0  Doing errands, shopping? 0  Preparing Food and eating ? N  Using the Toilet? N  In the past six months, have you accidently leaked urine? N  Do you have problems with loss of bowel control? N  Managing your Medications? N  Managing your Finances? N  Housekeeping or managing your Housekeeping? N    Patient Care Team: Gretta Comer POUR, NP as PCP - General (Internal Medicine)  I have updated your Care Teams any recent Medical Services you may have received from other providers in the past year.     Assessment:   This is a routine wellness examination for Elsey.  Hearing/Vision screen Hearing Screening - Comments:: No hearing difficulties  Vision Screening - Comments:: Patient wears glasses    Goals Addressed               This Visit's Progress     Patient Stated (pt-stated)   On track     Get to work out as much as can        Depression Screen     12/30/2023    9:58 AM 11/28/2023   10:16 AM 12/25/2022    9:07 AM 12/21/2021   12:22 PM 08/16/2021    2:21 PM 05/02/2021   10:07 AM 03/03/2020   11:38 AM  PHQ 2/9 Scores  PHQ - 2 Score 0 0 0 0 0 0 0  PHQ- 9 Score 0     0 0    Fall Risk     12/29/2023    5:06 PM 11/28/2023   10:16 AM 12/24/2022    9:23 AM 11/21/2022    3:25 PM 07/16/2022    2:33 PM  Fall Risk   Falls in the past year? 0 0 0 0 0  Number falls in past yr: 0 0  0 0  Injury with Fall? 0 0  0 0  Risk for fall due to : No Fall Risks No Fall Risks Impaired vision No Fall Risks No Fall Risks  Follow up Falls evaluation completed Falls evaluation completed Falls prevention discussed Falls evaluation completed Falls evaluation completed    MEDICARE RISK AT HOME:  Medicare Risk at Home Any stairs in or around the  home?: (Patient-Rptd) Yes If so, are there any without handrails?:  (Patient-Rptd) No Home free of loose throw rugs in walkways, pet beds, electrical cords, etc?: (Patient-Rptd) No Adequate lighting in your home to reduce risk of falls?: (Patient-Rptd) Yes Life alert?: (Patient-Rptd) No Use of a cane, walker or w/c?: (Patient-Rptd) No Grab bars in the bathroom?: (Patient-Rptd) No Shower chair or bench in shower?: (Patient-Rptd) Yes Elevated toilet seat or a handicapped toilet?: (Patient-Rptd) No  TIMED UP AND GO:  Was the test performed?  No  Cognitive Function: 6CIT completed        12/30/2023    9:58 AM 12/25/2022    9:10 AM 12/21/2021   12:25 PM  6CIT Screen  What Year? 0 points 0 points 0 points  What month? 0 points 0 points 0 points  What time? 0 points 0 points 0 points  Count back from 20 0 points 0 points 0 points  Months in reverse 0 points 0 points 0 points  Repeat phrase 0 points 0 points 0 points  Total Score 0 points 0 points 0 points    Immunizations Immunization History  Administered Date(s) Administered   Fluad Quad(high Dose 65+) 03/11/2023   Influenza,inj,Quad PF,6+ Mos 07/17/2019   Influenza-Unspecified 03/22/2021, 02/15/2022   PFIZER(Purple Top)SARS-COV-2 Vaccination 10/15/2019, 11/05/2019, 06/09/2020, 03/22/2021   PNEUMOCOCCAL CONJUGATE-20 01/04/2022   Tdap 07/17/2019   Zoster Recombinant(Shingrix) 08/04/2019, 12/30/2019    Screening Tests Health Maintenance  Topic Date Due   COVID-19 Vaccine (5 - 2024-25 season) 02/03/2023   MAMMOGRAM  10/12/2023   INFLUENZA VACCINE  01/03/2024   DEXA SCAN  10/11/2024   Medicare Annual Wellness (AWV)  12/29/2024   Colonoscopy  07/08/2028   DTaP/Tdap/Td (2 - Td or Tdap) 07/16/2029   Pneumococcal Vaccine: 50+ Years  Completed   Hepatitis C Screening  Completed   Zoster Vaccines- Shingrix  Completed   Hepatitis B Vaccines  Aged Out   HPV VACCINES  Aged Out   Meningococcal B Vaccine  Aged Out    Health Maintenance  Health Maintenance Due  Topic Date Due   COVID-19  Vaccine (5 - 2024-25 season) 02/03/2023   MAMMOGRAM  10/12/2023   Health Maintenance Items Addressed:patient declined health maintenance   Additional Screening:  Vision Screening: Recommended annual ophthalmology exams for early detection of glaucoma and other disorders of the eye. Would you like a referral to an eye doctor? No    Dental Screening: Recommended annual dental exams for proper oral hygiene  Community Resource Referral / Chronic Care Management: CRR required this visit?  No   CCM required this visit?  No   Plan:    I have personally reviewed and noted the following in the patient's chart:   Medical and social history Use of alcohol, tobacco or illicit drugs  Current medications and supplements including opioid prescriptions. Patient is not currently taking opioid prescriptions. Functional ability and status Nutritional status Physical activity Advanced directives List of other physicians Hospitalizations, surgeries, and ER visits in previous 12 months Vitals Screenings to include cognitive, depression, and falls Referrals and appointments  In addition, I have reviewed and discussed with patient certain preventive protocols, quality metrics, and best practice recommendations. A written personalized care plan for preventive services as well as general preventive health recommendations were provided to patient.   Lyle MARLA Right, [place]   12/30/2023   After Visit Summary: (MyChart) Due to this being a telephonic visit, the after visit summary with patients personalized plan was offered to patient via  MyChart   Notes: Nothing significant to report at this time.

## 2023-12-30 NOTE — Patient Instructions (Signed)
 Ms. Cornwall , Thank you for taking time out of your busy schedule to complete your Annual Wellness Visit with me. I enjoyed our conversation and look forward to speaking with you again next year. I, as well as your care team,  appreciate your ongoing commitment to your health goals. Please review the following plan we discussed and let me know if I can assist you in the future. Your Game plan/ To Do List    Referrals: If you haven't heard from the office you've been referred to, please reach out to them at the phone provided.  None Follow up Visits: Next Medicare AWV with our clinical staff: 12/30/2024   Have you seen your provider in the last 6 months (3 months if uncontrolled diabetes)? No Next Office Visit with your provider: n/a  Clinician Recommendations:  Aim for 30 minutes of exercise or brisk walking, 6-8 glasses of water, and 5 servings of fruits and vegetables each day.       This is a list of the screening recommended for you and due dates:  Health Maintenance  Topic Date Due   COVID-19 Vaccine (5 - 2024-25 season) 02/03/2023   Mammogram  10/12/2023   Medicare Annual Wellness Visit  12/25/2023   Flu Shot  01/03/2024   DEXA scan (bone density measurement)  10/11/2024   Colon Cancer Screening  07/08/2028   DTaP/Tdap/Td vaccine (2 - Td or Tdap) 07/16/2029   Pneumococcal Vaccine for age over 11  Completed   Hepatitis C Screening  Completed   Zoster (Shingles) Vaccine  Completed   Hepatitis B Vaccine  Aged Out   HPV Vaccine  Aged Out   Meningitis B Vaccine  Aged Out    Advanced directives: (Declined) Advance directive discussed with you today. Even though you declined this today, please call our office should you change your mind, and we can give you the proper paperwork for you to fill out. Advance Care Planning is important because it:  [x]  Makes sure you receive the medical care that is consistent with your values, goals, and preferences  [x]  It provides guidance to your  family and loved ones and reduces their decisional burden about whether or not they are making the right decisions based on your wishes.  Follow the link provided in your after visit summary or read over the paperwork we have mailed to you to help you started getting your Advance Directives in place. If you need assistance in completing these, please reach out to us  so that we can help you!  See attachments for Preventive Care and Fall Prevention Tips.

## 2023-12-30 NOTE — Therapy (Signed)
 OUTPATIENT PHYSICAL THERAPY THORACOLUMBAR TREATMENT   Patient Name: Kelly James MRN: [id number] DOB:1955/11/29, 68 y.o., female Today's Date: 12/30/2023  END OF SESSION:  PT End of Session - 12/30/23 1521     Visit Number 5    Number of Visits 24    Date for PT Re-Evaluation 02/25/24    Authorization Type [employer] 2025    Authorization - Visit Number 5    Authorization - Number of Visits 24    Progress Note Due on Visit 10    PT Start Time 1517    PT Stop Time 1600    PT Time Calculation (min) 43 min    Activity Tolerance Patient tolerated treatment well    Behavior During Therapy [facility] for tasks assessed/performed            Past Medical History:  Diagnosis Date   Atypical mole 09/10/2019   L ant thigh near the knee    Migraines    UTI (urinary tract infection)    Past Surgical History:  Procedure Laterality Date   TUBAL LIGATION  1984   Patient Active Problem List   Diagnosis Date Noted   Chronic midline thoracic back pain 11/28/2023   Fainting spell 01/24/2023   Foot mass, left 11/21/2022   Urinary frequency 06/20/2022   Urinary incontinence 06/20/2022   Acute shoulder pain 11/03/2021   Palpitations 08/16/2021   Other fatigue 07/05/2021   Rash and nonspecific skin eruption 05/02/2021   Elevated blood pressure reading in office without diagnosis of hypertension 05/02/2021   Frequent headaches 10/21/2020   Joint stiffness 03/03/2020   Genital herpes 03/03/2020   Preventative health care 07/17/2019   Hyperlipidemia 07/17/2019   GERD (gastroesophageal reflux disease) 07/17/2019   Vertigo 08/27/2018   Knee pain 05/03/2017    PCP: Comer Gaskins NP  REFERRING PROVIDER: Comer Gaskins NP   REFERRING DIAG: [phone] (ICD-10-CM) - Chronic midline thoracic back pain  Rationale for Evaluation and Treatment: Rehabilitation  THERAPY DIAG:  Other low back pain  Mid back pain  ONSET DATE: Started 1992    SUBJECTIVE:                                                                                                                                                                                            SUBJECTIVE STATEMENT: Pt states that had intermittent low back pain after she performed the cow portion of the cat cow stretch. She also notices it in her low back when sitting on couch and propping pillows behind her back. She is also having difficulty with the modified bird dog especially with kicking her right back. Pt  reports swelling along right sternocleidomastoid compared to left .      PERTINENT HISTORY:  Pt reports that she started to experiencing pain when gardening, but it is not until recently that started to notice it more. 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 She has increased mid and low back pain when bending forward for long periods of time. She does not experience increased pain in the morning, but just when she is performing baking related tasks and sporadically when raising up from bending forward.   PAIN:  Are you having pain? No  PRECAUTIONS: None  RED FLAGS: None   WEIGHT BEARING RESTRICTIONS: No  FALLS:  Has patient fallen in last 6 months? No  LIVING ENVIRONMENT: Lives with: lives with their family Lives in: House/apartment Stairs: Yes: Internal: 13 steps; on right going up and External: 6 steps; on right going up Has following equipment at home: Single point cane  OCCUPATION: Retired  PLOF: Independent  PATIENT GOALS: Pt wants to experience less pain when working and know how to treat this condition.    OBJECTIVE:  Note: Objective measures were completed at Evaluation unless otherwise noted.  VITALS: BP 136/77 HR 77   DIAGNOSTIC FINDINGS:  NEXT MD VISIT: CLINICAL DATA:  chronic thoracic back pain   EXAM: THORACIC SPINE - 3 VIEWS   COMPARISON:  None Available.   FINDINGS: Eleven rib-bearing thoracic type vertebral bodies. Otherwise, normal alignment with  expected thoracic kyphosis. The cervicothoracic junction alignment is normal. Vertebral body heights are well maintained without acute fracture. Mild intervertebral disc height loss involving a couple of midthoracic vertebral bodies with fairly diffuse osteophyte formation throughout the thoracic spine. The soft tissues are otherwise unremarkable.   IMPRESSION: 1. No acute fracture or malalignment of the thoracic spine. 2. Mild degenerative disc disease within the midthoracic spine.     Electronically Signed   By: Rogelia Myers M.D.   On: 11/28/2023 10:48  PATIENT SURVEYS:   Patient Specific Functional Scale (PSFS):    COGNITION: Overall cognitive status: Within functional limits for tasks assessed     SENSATION: WFL  MUSCLE LENGTH: Hamstrings: Right 90 deg; Left 90 deg Thomas test: 0 degrees bilaterally  Ely's Test: Negative bilaterally   POSTURE: rounded shoulders  PALPATION: No area TTP, increased tension in thoracic paraspinals   LUMBAR ROM:   AROM eval  Flexion 100%  Extension 100%  Right lateral flexion 100%  Left lateral flexion 100%  Right rotation 100%  Left rotation 100%   (Blank rows = not tested)  LOWER EXTREMITY ROM:     Active  Right eval Left eval  Hip flexion    Hip extension    Hip abduction    Hip adduction    Hip internal rotation    Hip external rotation    Knee flexion    Knee extension    Ankle dorsiflexion    Ankle plantarflexion    Ankle inversion    Ankle eversion     (Blank rows = not tested)  LOWER EXTREMITY MMT:    MMT Right eval Left eval  Hip flexion 4 4  Hip extension 4- 4-  Hip abduction 4 4  Hip adduction 4 4  Hip internal rotation 4 4  Hip external rotation 4 4  Knee flexion 4 4  Knee extension 4 4  Ankle dorsiflexion 4 4  Ankle plantarflexion    Ankle inversion    Ankle eversion     (Blank rows =  not tested)  PERISCAPULAR STRENGTH                                 Cervical ROM: No radicular  symptoms with any cervical movements                                                                 Cervical                                AROM                                            Flex    45           45                                           Ext      45           45                       Lat Side Bend R/L 45/45        45/40                       Rotation       R/L     60/60       60/60      MMT Right Eval (12/17/23) Left Eval (12/17/23)  Shoulder flexion    Shoulder extension 4 4  Shoulder abduction    Shoulder adduction    Shoulder internal rotation    Shoulder external rotation    Rhomboids      Mid Trap  4 4  Lower Trap   4 4-  Elbow flexion    Elbow extension    Wrist flexion    Wrist extension    Wrist ulnar deviation    Wrist radial deviation    Wrist pronation    Wrist supination    Combined ER     Combined IR                                 (Blank rows = not tested)     LUMBAR SPECIAL TESTS:  Straight leg raise test: Negative, FABER test: Negative, Thomas test: Negative, and FADIR Negative   FUNCTIONAL TESTS:  Squats: NT, Single Leg Stance Test: NT    GAIT: No gait analysis performed    TREATMENT DATE:   12/30/23: THEREX  UBE with seat at 6 2.5 min forward and 2.5 min backward    Palpation of right SCM: swollen and descended compared to left SCM VITALS:  BP 136/77 Standing Hip Extension with BUE support # 2 AW 1 x 10   Standing Hip Extension with BUE support  # 5 AW 2 x 10  Standing Hip Abduction with BUE support #5 AW 2 x 10   Standing Horizontal Shoulder Abduction AROM 1 x 10  Standing Horizontal Shoulder Abduction #3 DB 1 x 10  -Pt reports increased pain in shoulders   Standing Horizontal Shoulder Abduction #2 DB 2 x 10     PATIENT EDUCATION:  Education details: Form and technique for correct performance of exercise and explanation about potential causes for underlying deficits  Person educated: Patient Education method:  Programmer, multimedia, Demonstration, Verbal cues, and Handouts Education comprehension: verbalized understanding, returned demonstration, and verbal cues required  HOME EXERCISE PROGRAM: Access Code: [email or web address] URL: [email or web address] Date: 12/30/2023 Prepared by: Toribio Servant  Exercises - Seated 3 Way Exercise Ball Roll Out Stretch  - 1 x daily - 7 x weekly - 3 sets - 10 reps - Supine Lower Trunk Rotation  - 1 x daily - 7 x weekly - 2 sets - 10 reps - 3 sec  hold - Cat Cow  - 1 x daily - 7 x weekly - 2 sets - 10 reps - 5-10 sec  hold - Child's Pose Stretch  - 1 x daily - 7 x weekly - 3 reps - 30-60 sec hold - Standing Anti-Rotation Press with Anchored Resistance  - 3-4 x weekly - 3 sets - 10 reps - Seated Shoulder Horizontal Abduction with Dumbbells - Thumbs Up  - 3-4 x weekly - 3 sets - 10 reps - Shoulder Bent over Y's   - 3-4 x weekly - 3 sets - 10 reps - Standing Hip Extension  with ankle weights    - 3-4 x weekly - 3 sets - 10 reps - Standing Hip Abduction with Counter Support  - 3-4 x weekly - 3 sets - 10 reps  ASSESSMENT:  CLINICAL IMPRESSION: Pt has what appears to be jugular venous distension on right anterior neck. Vitals stable and she has no other cardiac symptoms aside from this and PT instructed pt to contact PCP for further medical evaluation and treatment. She was able to perform all LE and periscapular strengthening exercises without an increase in her mid back or low back pain. She will continue to benefit from skilled PT to address these aforementioned deficits to continue to stand and bend to perform home care and baking tasks without being limited by pain.   OBJECTIVE IMPAIRMENTS: decreased activity tolerance, decreased strength, and pain.   ACTIVITY LIMITATIONS: bending, toileting, and hygiene/grooming  PARTICIPATION LIMITATIONS: meal prep, cleaning, and occupation  PERSONAL FACTORS: Age, Fitness, Time since onset of injury/illness/exacerbation, and 1  comorbidity: HLD are also affecting patient's functional outcome.   REHAB POTENTIAL: Good  CLINICAL DECISION MAKING: Stable/uncomplicated  EVALUATION COMPLEXITY: Low   GOALS: Goals reviewed with patient? No  SHORT TERM GOALS: Target date: 12/17/2023  Patient will demonstrate undestanding of home exercise plan by performing exercises correctly with evidence of good carry over with min to no verbal or tactile cues .   Baseline: NT 12/10/23: Performing independently   Goal status: ACHIEVED    2.  Patient will demonstrate understanding of correct ergonomic work place setup to avoid straining mid and low back by demonstrating this setup for physical therapist with little to no cueing. Baseline: NT 12/17/23: Pt's activities require some rounding of shoulders, but further analysis needs to be completed Goal status: ACHIEVED      LONG TERM GOALS: Target date: 02/25/2024  Patient will demonstrate an improvement of >=3 pts on the patient specific functional scale (PSFS) as evidence of an in  improvement in function that is not due to chance.  Baseline: 7 for baseline with washing dishes,  9 sitting for a long period of time Goal status: ONGOING  2.  Patient will improve hip strength >=1/3 grade MMT (ie 4- to 4) and abdominal strength by >=1 grade for improved spinal stability and low back function. Baseline: Hip Ext R/L 4-/4- , Hip Flex R/L 4/4,  Goal status: ONGOING    3.  Patient will improve shoulder and periscapular strength by >=1/3 grade MMT (Iie 4- to 4) for improved thoracic stability and function to return to maintaining standing forward trunk flexion positions without pain and discomfort to perform home care and baking tasks without being limited by pain and discomfort.   Baseline: Lower Trap R/L 4/4-, Mid Trap R/L 4/4, Shoulder Extension R/L 4/4   Goal status: ONGOING     PLAN:  PT FREQUENCY: 1-2x/week  PT DURATION: 12 weeks  PLANNED INTERVENTIONS: 97164- PT Re-evaluation,  97750- Physical Performance Testing, 97110-Therapeutic exercises, 97530- Therapeutic activity, 97112- Neuromuscular re-education, 97535- Self Care, 02859- Manual therapy, [id number]- Gait training, [phone]- Canalith repositioning, [id number]- Aquatic Therapy, [phone]- Electrical stimulation (unattended), [phone]- Electrical stimulation (manual), [id number]- Traction (mechanical), 20560 (1-2 muscles), 20561 (3+ muscles)- Dry Needling, Patient/Family education, Balance training, Stair training, Taping, Joint mobilization, Joint manipulation, Spinal manipulation, Spinal mobilization, Vestibular training, DME instructions, Cryotherapy, and Moist heat.  PLAN FOR NEXT SESSION: Prone Pess up at wall to see if her low back pain can be reproduced. OMEGA Lat Pull Down and face pulls. Test rest of shoulder strength.  Manual to reduce thoracic paraspinal tension.   Toribio Servant PT, DPT  [facility] [address], [place], [address] Phone: [phone]   Fax:  [phone]

## 2023-12-31 NOTE — Telephone Encounter (Signed)
 Noted, will evaluate.

## 2024-01-01 ENCOUNTER — Ambulatory Visit (INDEPENDENT_AMBULATORY_CARE_PROVIDER_SITE_OTHER): Admitting: Primary Care

## 2024-01-01 ENCOUNTER — Encounter: Payer: Self-pay | Admitting: Primary Care

## 2024-01-01 ENCOUNTER — Encounter: Admitting: Physical Therapy

## 2024-01-01 VITALS — BP 126/82 | HR 63 | Temp 97.6°F | Ht 60.0 in | Wt 136.0 lb

## 2024-01-01 DIAGNOSIS — R229 Localized swelling, mass and lump, unspecified: Secondary | ICD-10-CM | POA: Diagnosis not present

## 2024-01-01 NOTE — Progress Notes (Signed)
 Subjective:    Patient ID: Kelly James, female    DOB: 1955/11/18, 68 y.o.   MRN: [id number]  HPI  Kelly James is a very pleasant 68 y.o. female with a history of hyperlipidemia, urinary incontinence, joint stiffness, frequent headaches who presents today to discuss neck swelling.  Her swelling is located to the right lateral neck for which she noticed after a haircut five days ago. She mentioned it to her physical therapist who recommended an appointment with PCP.   She denies pain, redness, pulsation of the swelling, neck pain, recent illness, overuse of her neck.    Review of Systems  HENT:  Negative for postnasal drip.   Respiratory:  Negative for shortness of breath.   Musculoskeletal:  Negative for myalgias, neck pain and neck stiffness.  Hematological:  Negative for adenopathy.         Past Medical History:  Diagnosis Date   Atypical mole 09/10/2019   L ant thigh near the knee    Migraines    UTI (urinary tract infection)     Social History   Socioeconomic History   Marital status: Married    Spouse name: Not on file   Number of children: Not on file   Years of education: Not on file   Highest education level: 12th grade  Occupational History   Not on file  Tobacco Use   Smoking status: Never   Smokeless tobacco: Never  Vaping Use   Vaping status: Never Used  Substance and Sexual Activity   Alcohol use: No   Drug use: No   Sexual activity: Not on file  Other Topics Concern   Not on file  Social History Narrative   Married.   2 children, 2 grandchildren.   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   Enjoys baking.   Social Drivers of Corporate investment banker Strain: Low Risk  (12/30/2023)   Overall Financial Resource Strain (CARDIA)    Difficulty of Paying Living Expenses: Not hard at all  Food Insecurity: No Food Insecurity (12/30/2023)   Hunger Vital Sign    Worried About Running Out of Food in the Last Year: Never true    Ran Out of Food in  the Last Year: Never true  Transportation Needs: No Transportation Needs (12/30/2023)   PRAPARE - Administrator, Civil Service (Medical): No    Lack of Transportation (Non-Medical): No  Physical Activity: Sufficiently Active (12/30/2023)   Exercise Vital Sign    Days of Exercise per Week: 5 days    Minutes of Exercise per Session: 30 min  Stress: Patient Declined (12/30/2023)   [employer] of Occupational Health - Occupational Stress Questionnaire    Feeling of Stress: Patient declined  Social Connections: Moderately Isolated (12/30/2023)   Social Connection and Isolation Panel    Frequency of Communication with Friends and Family: More than three times a week    Frequency of Social Gatherings with Friends and Family: More than three times a week    Attends Religious Services: Never    Database administrator or Organizations: No    Attends Banker Meetings: Never    Marital Status: Married  Catering manager Violence: Not At Risk (12/30/2023)   Humiliation, Afraid, Rape, and Kick questionnaire    Fear of Current or Ex-Partner: No    Emotionally Abused: No    Physically Abused: No    Sexually Abused: No    Past Surgical History:  Procedure  Laterality Date   TUBAL LIGATION  1984    Family History  Problem Relation Age of Onset   Arthritis Mother    Alzheimer's disease Mother    Alcohol abuse Father    Asthma Sister    Asthma Brother     Allergies  Allergen Reactions   Codeine     Other reaction(s): Agitation   Sulfa Antibiotics Hives    Current Outpatient Medications on File Prior to Visit  Medication Sig Dispense Refill   acyclovir (ZOVIRAX) 400 MG tablet Take 400 mg by mouth 2 (two) times daily.     ALFALFA PO Take by mouth.     Ascorbic Acid (CHEWABLE VITAMIN C PO) Take 100 mg by mouth.     Ascorbic Acid (VITA-C PO)      B Complex-Biotin-FA (B COMPLETE PO) Take by mouth.     CALCIUM PO Take 1,560 mg by mouth daily.     Cholecalciferol  (D3-1000 PO) Take 1 tablet by mouth daily. Vitamin D     Misc Natural Products (JOINT SUPPORT COMPLEX PO) Take by mouth.     Nutritional Supplements (NUTRITIONAL SUPPLEMENT PO) Take by mouth. Stress Relief Complex-One tablet daily     Nutritional Supplements (NUTRITIONAL SUPPLEMENT PO) Shaklee Collagen 9-2 scoops daily     Nutritional Supplements (NUTRITIONAL SUPPLEMENT PO) Take by mouth. Shaklee Cholesterol Reduction - 2 tablets by mouth twice a day     Nutritional Supplements (NUTRITIONAL SUPPLEMENT PO) Take by mouth. Shaklee Co Q Heart-1 capsule by mouth daily     Nutritional Supplements (SOY PROTEIN SHAKE) POWD Take by mouth.     Omega-3 Fatty Acids (FISH OIL) 1000 MG CAPS Take by mouth.     OVER THE COUNTER MEDICATION Optiflora Prebiotic & Probiotic System-1 tablet by mouth every morning     Propylene Glycol (SYSTANE BALANCE OP) Apply to eye.     TART CHERRY PO Take 3,600 mg by mouth daily.     TURMERIC PO Take by mouth.     UNABLE TO FIND Med Name: CarotoMax and Eye Taking 1 capsule by mouth after breakfast daily     UNABLE TO FIND Herb-Lax Taking 1 tablet by mouth after breakfast daily     UNABLE TO FIND Med Name: Mind Work     VITAMIN E PO Take by mouth.     Zinc Sulfate (ZINC 15 PO) Take by mouth.     No current facility-administered medications on file prior to visit.    BP 126/82   Pulse 63   Temp 97.6 F (36.4 C) (Temporal)   Ht 5' (1.524 m)   Wt 136 lb (61.7 kg)   SpO2 97%   BMI 26.56 kg/m  Objective:   Physical Exam Neck:     Thyroid : No thyromegaly.     Vascular: Normal carotid pulses. No carotid bruit.      Comments: No obvious swelling but the right lateral neck appears slightly more enlarged than the left.  Cardiovascular:     Rate and Rhythm: Normal rate.  Pulmonary:     Effort: Pulmonary effort is normal.  Musculoskeletal:     Cervical back: No tenderness.  Lymphadenopathy:     Cervical: No cervical adenopathy.  Skin:    General: Skin is warm and  dry.           Assessment & Plan:  Localized skin mass, lump, or swelling Assessment & Plan: Differentials include lymphadenopathy, sternocleidomastoid irritation, carotid artery disease, CHF Exam today not suspicious for CHF  or CAD.  Will obtain soft tissue ultrasound for further evaluation.  She agrees. Orders placed.  She will continue to monitor the site and notify if she develops increased swelling, pain, pulsation.  Orders: -     US  SOFT TISSUE HEAD & NECK (NON-THYROID ); Future        Tatijana Bierly K Hanin [place]ook, NP

## 2024-01-01 NOTE — Patient Instructions (Signed)
 You will receive a phone call regarding the ultrasound.  Please monitor the site as discussed.  It was a pleasure to see you today!

## 2024-01-01 NOTE — Assessment & Plan Note (Addendum)
 Differentials include lymphadenopathy, sternocleidomastoid irritation, carotid artery disease, CHF Exam today not suspicious for CHF or CAD.  Will obtain soft tissue ultrasound for further evaluation.  She agrees. Orders placed.  She will continue to monitor the site and notify if she develops increased swelling, pain, pulsation.

## 2024-01-03 ENCOUNTER — Ambulatory Visit
Admission: RE | Admit: 2024-01-03 | Discharge: 2024-01-03 | Disposition: A | Discharge status: Home / Self Care | Source: Ambulatory Visit | Attending: Primary Care | Admitting: Primary Care

## 2024-01-03 DIAGNOSIS — R590 Localized enlarged lymph nodes: Secondary | ICD-10-CM | POA: Diagnosis not present

## 2024-01-03 DIAGNOSIS — R229 Localized swelling, mass and lump, unspecified: Secondary | ICD-10-CM | POA: Diagnosis not present

## 2024-01-06 ENCOUNTER — Ambulatory Visit: Attending: Internal Medicine | Admitting: Physical Therapy

## 2024-01-06 DIAGNOSIS — M549 Dorsalgia, unspecified: Secondary | ICD-10-CM | POA: Diagnosis not present

## 2024-01-06 DIAGNOSIS — M25512 Pain in left shoulder: Secondary | ICD-10-CM | POA: Diagnosis not present

## 2024-01-06 DIAGNOSIS — M25511 Pain in right shoulder: Secondary | ICD-10-CM | POA: Insufficient documentation

## 2024-01-06 DIAGNOSIS — M5459 Other low back pain: Secondary | ICD-10-CM | POA: Diagnosis not present

## 2024-01-06 NOTE — Therapy (Signed)
 OUTPATIENT PHYSICAL THERAPY THORACOLUMBAR TREATMENT   Patient Name: Kelly James MRN: [id number] DOB:10-31-1955, 68 y.o., female Today's Date: 01/06/2024  END OF SESSION:  PT End of Session - 01/06/24 1306     Visit Number 6    Number of Visits 24    Date for PT Re-Evaluation 02/25/24    Authorization Type [employer] 2025    Authorization - Visit Number 6    Authorization - Number of Visits 24    Progress Note Due on Visit 10    PT Start Time 1303    PT Stop Time 1345    PT Time Calculation (min) 42 min    Activity Tolerance Patient tolerated treatment well    Behavior During Therapy [facility] for tasks assessed/performed            Past Medical History:  Diagnosis Date   Atypical mole 09/10/2019   L ant thigh near the knee    Migraines    UTI (urinary tract infection)    Past Surgical History:  Procedure Laterality Date   TUBAL LIGATION  1984   Patient Active Problem List   Diagnosis Date Noted   Localized skin mass, lump, or swelling 01/01/2024   Chronic midline thoracic back pain 11/28/2023   Fainting spell 01/24/2023   Foot mass, left 11/21/2022   Urinary frequency 06/20/2022   Urinary incontinence 06/20/2022   Acute shoulder pain 11/03/2021   Palpitations 08/16/2021   Other fatigue 07/05/2021   Rash and nonspecific skin eruption 05/02/2021   Elevated blood pressure reading in office without diagnosis of hypertension 05/02/2021   Frequent headaches 10/21/2020   Joint stiffness 03/03/2020   Genital herpes 03/03/2020   Preventative health care 07/17/2019   Hyperlipidemia 07/17/2019   GERD (gastroesophageal reflux disease) 07/17/2019   Vertigo 08/27/2018   Knee pain 05/03/2017    PCP: Comer Gaskins NP  REFERRING PROVIDER: Comer Gaskins NP   REFERRING DIAG: [phone] (ICD-10-CM) - Chronic midline thoracic back pain  Rationale for Evaluation and Treatment: Rehabilitation  THERAPY DIAG:  Other low back pain  Mid back pain  ONSET DATE:  Started 1992    SUBJECTIVE:                                                                                                                                                                                           SUBJECTIVE STATEMENT: Pt states that her exercises have been going fairly well with little to no increase in pain. She did have low back pain when standing up from a chair in her lower back.      PERTINENT HISTORY:  Pt reports that she  started to experiencing pain when gardening, but it is not until recently that started to notice it more. 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 She has increased mid and low back pain when bending forward for long periods of time. She does not experience increased pain in the morning, but just when she is performing baking related tasks and sporadically when raising up from bending forward.   PAIN:  Are you having pain? No  PRECAUTIONS: None  RED FLAGS: None   WEIGHT BEARING RESTRICTIONS: No  FALLS:  Has patient fallen in last 6 months? No  LIVING ENVIRONMENT: Lives with: lives with their family Lives in: House/apartment Stairs: Yes: Internal: 13 steps; on right going up and External: 6 steps; on right going up Has following equipment at home: Single point cane  OCCUPATION: Retired  PLOF: Independent  PATIENT GOALS: Pt wants to experience less pain when working and know how to treat this condition.    OBJECTIVE:  Note: Objective measures were completed at Evaluation unless otherwise noted.  VITALS: BP 136/77 HR 77   DIAGNOSTIC FINDINGS:  NEXT MD VISIT: CLINICAL DATA:  chronic thoracic back pain   EXAM: THORACIC SPINE - 3 VIEWS   COMPARISON:  None Available.   FINDINGS: Eleven rib-bearing thoracic type vertebral bodies. Otherwise, normal alignment with expected thoracic kyphosis. The cervicothoracic junction alignment is normal. Vertebral body heights are well maintained without acute  fracture. Mild intervertebral disc height loss involving a couple of midthoracic vertebral bodies with fairly diffuse osteophyte formation throughout the thoracic spine. The soft tissues are otherwise unremarkable.   IMPRESSION: 1. No acute fracture or malalignment of the thoracic spine. 2. Mild degenerative disc disease within the midthoracic spine.     Electronically Signed   By: Rogelia Myers M.D.   On: 11/28/2023 10:48  PATIENT SURVEYS:   Patient Specific Functional Scale (PSFS):    COGNITION: Overall cognitive status: Within functional limits for tasks assessed     SENSATION: WFL  MUSCLE LENGTH: Hamstrings: Right 90 deg; Left 90 deg Thomas test: 0 degrees bilaterally  Ely's Test: Negative bilaterally   POSTURE: rounded shoulders  PALPATION: No area TTP, increased tension in thoracic paraspinals   LUMBAR ROM:   AROM eval  Flexion 100%  Extension 100%  Right lateral flexion 100%  Left lateral flexion 100%  Right rotation 100%  Left rotation 100%   (Blank rows = not tested)  LOWER EXTREMITY ROM:     Active  Right eval Left eval  Hip flexion    Hip extension    Hip abduction    Hip adduction    Hip internal rotation    Hip external rotation    Knee flexion    Knee extension    Ankle dorsiflexion    Ankle plantarflexion    Ankle inversion    Ankle eversion     (Blank rows = not tested)  LOWER EXTREMITY MMT:    MMT Right eval Left eval  Hip flexion 4 4  Hip extension 4- 4-  Hip abduction 4 4  Hip adduction 4 4  Hip internal rotation 4 4  Hip external rotation 4 4  Knee flexion 4 4  Knee extension 4 4  Ankle dorsiflexion 4 4  Ankle plantarflexion    Ankle inversion    Ankle eversion     (Blank rows = not tested)  PERISCAPULAR STRENGTH  Cervical ROM: No radicular symptoms with any cervical movements                                                                 Cervical                                 AROM                                            Flex    45           45                                           Ext      45           45                       Lat Side Bend R/L 45/45        45/40                       Rotation       R/L     60/60       60/60      MMT Right Eval (12/17/23) Left Eval (12/17/23)  Shoulder flexion    Shoulder extension 4 4  Shoulder abduction    Shoulder adduction    Shoulder internal rotation    Shoulder external rotation    Rhomboids      Mid Trap  4 4  Lower Trap   4 4-  Elbow flexion    Elbow extension    Wrist flexion    Wrist extension    Wrist ulnar deviation    Wrist radial deviation    Wrist pronation    Wrist supination    Combined ER     Combined IR                                 (Blank rows = not tested)     LUMBAR SPECIAL TESTS:  Straight leg raise test: Negative, FABER test: Negative, Thomas test: Negative, and FADIR Negative   FUNCTIONAL TESTS:  Squats: NT, Single Leg Stance Test: NT    GAIT: No gait analysis performed    TREATMENT DATE:   01/06/24: THEREX  UBE with seat at 6 2.5 min forward and 2.5 min backward resistance at 2  Bent Over T's  (Shoulder Horizontal Abduction) #2 DB 2 x 10  -min VC to increase forward lumbar flexion   Bent Over T's using both UE #2 DB 1 x 10   Pillowcase wall slides for serratus activation 2 x 10   -min VC to keep contact with elbows against wall and to push through elbows    Front Plank on forearm 3 x 10 sec hold  [employer] with extended elbow  with 10 sec hold   Standing Hip Extension #5 AW with red band and BUE support 1 x 10   Standing Hip Extension #5 AW with 1 UE 1 x 10  Standing Hip Extension #5 AW  with no UE support 1 x 10   Standing Hip Extension with increased forward flexion 1 x 10   Standing Hip Abduction #5 AW with 1 UE support 1 x 10    PATIENT EDUCATION:  Education details: Form and technique for correct performance of exercise and explanation about potential  causes for underlying deficits  Person educated: Patient Education method: Programmer, multimedia, Demonstration, Verbal cues, and Handouts Education comprehension: verbalized understanding, returned demonstration, and verbal cues required  HOME EXERCISE PROGRAM: Access Code: [email or web address] URL: [email or web address] Date: 01/06/2024 Prepared by: Toribio Servant  Exercises - Seated 3 Way Exercise Ball Roll Out Stretch  - 1 x daily - 7 x weekly - 3 sets - 10 reps - Supine Lower Trunk Rotation  - 1 x daily - 7 x weekly - 2 sets - 10 reps - 3 sec  hold - Cat Cow  - 1 x daily - 7 x weekly - 2 sets - 10 reps - 5-10 sec  hold - Child's Pose Stretch  - 1 x daily - 7 x weekly - 3 reps - 30-60 sec hold - Seated Shoulder Horizontal Abduction with Dumbbells - Thumbs Up  - 3-4 x weekly - 3 sets - 10 reps - Standing Forward-Bent Horizontal Abduction With Dumbbells  - 3-4 x weekly - 3 sets - 10 reps - Shoulder Bent over Y's   - 3-4 x weekly - 3 sets - 10 reps - Standing Hip Extension  with ankle weights    - 3-4 x weekly - 3 sets - 10 reps - Standing Hip Abduction with Counter Support  - 3-4 x weekly - 3 sets - 10 reps - Forearm to Full Planks  - 3-4 x weekly - 5 reps - 10 sec  hold  ASSESSMENT:  CLINICAL IMPRESSION: Pt continues to progress towards goals with improvement in LE strength with ability to perform strengthening exercises with less UE support. PT continued to modify exercises to avoid lumbar extension, which appears to be trigger for patient's low back pain. She will continue to benefit from skilled PT to address these aforementioned deficits to continue to stand and bend to perform home care and baking tasks without being limited by pain.    OBJECTIVE IMPAIRMENTS: decreased activity tolerance, decreased strength, and pain.   ACTIVITY LIMITATIONS: bending, toileting, and hygiene/grooming  PARTICIPATION LIMITATIONS: meal prep, cleaning, and occupation  PERSONAL FACTORS: Age, Fitness, Time  since onset of injury/illness/exacerbation, and 1 comorbidity: HLD are also affecting patient's functional outcome.   REHAB POTENTIAL: Good  CLINICAL DECISION MAKING: Stable/uncomplicated  EVALUATION COMPLEXITY: Low   GOALS: Goals reviewed with patient? No  SHORT TERM GOALS: Target date: 12/17/2023  Patient will demonstrate undestanding of home exercise plan by performing exercises correctly with evidence of good carry over with min to no verbal or tactile cues .   Baseline: NT 12/10/23: Performing independently   Goal status: ACHIEVED    2.  Patient will demonstrate understanding of correct ergonomic work place setup to avoid straining mid and low back by demonstrating this setup for physical therapist with little to no cueing. Baseline: NT 12/17/23: Pt's activities require some rounding of shoulders, but further analysis needs to be completed Goal status: ACHIEVED      LONG TERM GOALS: Target date:  02/25/2024  Patient will demonstrate an improvement of >=3 pts on the patient specific functional scale (PSFS) as evidence of an in improvement in function that is not due to chance.  Baseline: 7 for baseline with washing dishes,  9 sitting for a long period of time Goal status: ONGOING  2.  Patient will improve hip strength >=1/3 grade MMT (ie 4- to 4) and abdominal strength by >=1 grade for improved spinal stability and low back function. Baseline: Hip Ext R/L 4-/4- , Hip Flex R/L 4/4,  Goal status: ONGOING    3.  Patient will improve shoulder and periscapular strength by >=1/3 grade MMT (Iie 4- to 4) for improved thoracic stability and function to return to maintaining standing forward trunk flexion positions without pain and discomfort to perform home care and baking tasks without being limited by pain and discomfort.   Baseline: Lower Trap R/L 4/4-, Mid Trap R/L 4/4, Shoulder Extension R/L 4/4   Goal status: ONGOING     PLAN:  PT FREQUENCY: 1-2x/week  PT DURATION: 12  weeks  PLANNED INTERVENTIONS: 97164- PT Re-evaluation, 97750- Physical Performance Testing, 97110-Therapeutic exercises, 97530- Therapeutic activity, 97112- Neuromuscular re-education, 97535- Self Care, [id number]- Manual therapy, [id number]- Gait training, [phone]- Canalith repositioning, [id number]- Aquatic Therapy, [phone]- Electrical stimulation (unattended), [phone]- Electrical stimulation (manual), [id number]- Traction (mechanical), 20560 (1-2 muscles), 20561 (3+ muscles)- Dry Needling, Patient/Family education, Balance training, Stair training, Taping, Joint mobilization, Joint manipulation, Spinal manipulation, Spinal mobilization, Vestibular training, DME instructions, Cryotherapy, and Moist heat.  PLAN FOR NEXT SESSION: Reverse Monster Walks. Banded Side Step. Matrix resisted hip 4 way machine. OMEGA lat pull down and face pulls.   Toribio Servant PT, DPT  [facility] [address] [place], [address] Phone: [phone]   Fax:  [phone]

## 2024-01-08 ENCOUNTER — Encounter: Admitting: Physical Therapy

## 2024-01-08 ENCOUNTER — Ambulatory Visit: Payer: Self-pay | Admitting: Primary Care

## 2024-01-08 DIAGNOSIS — E041 Nontoxic single thyroid nodule: Secondary | ICD-10-CM

## 2024-01-10 ENCOUNTER — Encounter: Payer: Self-pay | Admitting: *Deleted

## 2024-01-13 ENCOUNTER — Ambulatory Visit: Admitting: Physical Therapy

## 2024-01-13 DIAGNOSIS — M25512 Pain in left shoulder: Secondary | ICD-10-CM | POA: Diagnosis not present

## 2024-01-13 DIAGNOSIS — M5459 Other low back pain: Secondary | ICD-10-CM

## 2024-01-13 DIAGNOSIS — M549 Dorsalgia, unspecified: Secondary | ICD-10-CM

## 2024-01-13 DIAGNOSIS — M25511 Pain in right shoulder: Secondary | ICD-10-CM | POA: Diagnosis not present

## 2024-01-13 NOTE — Therapy (Signed)
 OUTPATIENT PHYSICAL THERAPY THORACOLUMBAR TREATMENT   Patient Name: Kelly James MRN: [id number] DOB:16-Nov-1955, 68 y.o., female Today's Date: 01/13/2024  END OF SESSION:  PT End of Session - 01/13/24 1306     Visit Number 7    Number of Visits 24    Date for PT Re-Evaluation 02/25/24    Authorization Type [employer] 2025    Authorization - Visit Number 7    Authorization - Number of Visits 24    Progress Note Due on Visit 10    PT Start Time 1303    PT Stop Time 1345    PT Time Calculation (min) 42 min    Activity Tolerance Patient tolerated treatment well    Behavior During Therapy [facility] for tasks assessed/performed            Past Medical History:  Diagnosis Date   Atypical mole 09/10/2019   L ant thigh near the knee    Migraines    UTI (urinary tract infection)    Past Surgical History:  Procedure Laterality Date   TUBAL LIGATION  1984   Patient Active Problem List   Diagnosis Date Noted   Localized skin mass, lump, or swelling 01/01/2024   Chronic midline thoracic back pain 11/28/2023   Fainting spell 01/24/2023   Foot mass, left 11/21/2022   Urinary frequency 06/20/2022   Urinary incontinence 06/20/2022   Acute shoulder pain 11/03/2021   Palpitations 08/16/2021   Other fatigue 07/05/2021   Rash and nonspecific skin eruption 05/02/2021   Elevated blood pressure reading in office without diagnosis of hypertension 05/02/2021   Frequent headaches 10/21/2020   Joint stiffness 03/03/2020   Genital herpes 03/03/2020   Preventative health care 07/17/2019   Hyperlipidemia 07/17/2019   GERD (gastroesophageal reflux disease) 07/17/2019   Vertigo 08/27/2018   Knee pain 05/03/2017    PCP: Comer Gaskins NP  REFERRING PROVIDER: Comer Gaskins NP   REFERRING DIAG: [phone] (ICD-10-CM) - Chronic midline thoracic back pain  Rationale for Evaluation and Treatment: Rehabilitation  THERAPY DIAG:  Other low back pain  Mid back pain  ONSET DATE:  Started 1992    SUBJECTIVE:                                                                                                                                                                                           SUBJECTIVE STATEMENT: Pt reports continuing to have increased soreness in her low back pain that remains pretty constant. She occasionally feels the thoracic mid back pain but only when she is doing baking or upper extremity tasks.      PERTINENT HISTORY:  Pt reports  that she started to experiencing pain when gardening, but it is not until recently that started to notice it more. 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 She has increased mid and low back pain when bending forward for long periods of time. She does not experience increased pain in the morning, but just when she is performing baking related tasks and sporadically when raising up from bending forward.   PAIN:  Are you having pain? No  PRECAUTIONS: None  RED FLAGS: None   WEIGHT BEARING RESTRICTIONS: No  FALLS:  Has patient fallen in last 6 months? No  LIVING ENVIRONMENT: Lives with: lives with their family Lives in: House/apartment Stairs: Yes: Internal: 13 steps; on right going up and External: 6 steps; on right going up Has following equipment at home: Single point cane  OCCUPATION: Retired  PLOF: Independent  PATIENT GOALS: Pt wants to experience less pain when working and know how to treat this condition.    OBJECTIVE:  Note: Objective measures were completed at Evaluation unless otherwise noted.  VITALS: BP 136/77 HR 77   DIAGNOSTIC FINDINGS:  NEXT MD VISIT: CLINICAL DATA:  chronic thoracic back pain   EXAM: THORACIC SPINE - 3 VIEWS   COMPARISON:  None Available.   FINDINGS: Eleven rib-bearing thoracic type vertebral bodies. Otherwise, normal alignment with expected thoracic kyphosis. The cervicothoracic junction alignment is normal. Vertebral body heights  are well maintained without acute fracture. Mild intervertebral disc height loss involving a couple of midthoracic vertebral bodies with fairly diffuse osteophyte formation throughout the thoracic spine. The soft tissues are otherwise unremarkable.   IMPRESSION: 1. No acute fracture or malalignment of the thoracic spine. 2. Mild degenerative disc disease within the midthoracic spine.     Electronically Signed   By: Rogelia Myers M.D.   On: 11/28/2023 10:48  PATIENT SURVEYS:   Patient Specific Functional Scale (PSFS):    COGNITION: Overall cognitive status: Within functional limits for tasks assessed     SENSATION: WFL  MUSCLE LENGTH: Hamstrings: Right 90 deg; Left 90 deg Thomas test: 0 degrees bilaterally  Ely's Test: Negative bilaterally   POSTURE: rounded shoulders  PALPATION: No area TTP, increased tension in thoracic paraspinals   LUMBAR ROM:   AROM eval  Flexion 100%  Extension 100%  Right lateral flexion 100%  Left lateral flexion 100%  Right rotation 100%  Left rotation 100%   (Blank rows = not tested)  LOWER EXTREMITY ROM:     Active  Right eval Left eval  Hip flexion    Hip extension    Hip abduction    Hip adduction    Hip internal rotation    Hip external rotation    Knee flexion    Knee extension    Ankle dorsiflexion    Ankle plantarflexion    Ankle inversion    Ankle eversion     (Blank rows = not tested)  LOWER EXTREMITY MMT:    MMT Right eval Left eval  Hip flexion 4 4  Hip extension 4- 4-  Hip abduction 4 4  Hip adduction 4 4  Hip internal rotation 4 4  Hip external rotation 4 4  Knee flexion 4 4  Knee extension 4 4  Ankle dorsiflexion 4 4  Ankle plantarflexion    Ankle inversion    Ankle eversion     (Blank rows = not tested)  PERISCAPULAR STRENGTH  Cervical ROM: No radicular symptoms with any cervical movements                                                                  Cervical                                AROM                                            Flex    45           45                                           Ext      45           45                       Lat Side Bend R/L 45/45        45/40                       Rotation       R/L     60/60       60/60      MMT Right Eval (12/17/23) Left Eval (12/17/23)  Shoulder flexion    Shoulder extension 4 4  Shoulder abduction    Shoulder adduction    Shoulder internal rotation    Shoulder external rotation    Rhomboids      Mid Trap  4 4  Lower Trap   4 4-  Elbow flexion    Elbow extension    Wrist flexion    Wrist extension    Wrist ulnar deviation    Wrist radial deviation    Wrist pronation    Wrist supination    Combined ER     Combined IR                                 (Blank rows = not tested)     LUMBAR SPECIAL TESTS:  Straight leg raise test: Negative, FABER test: Negative, Thomas test: Negative, and FADIR Negative   FUNCTIONAL TESTS:  Squats: NT, Single Leg Stance Test: NT    GAIT: No gait analysis performed    TREATMENT DATE:   01/13/24: THEREX  Nu-Step with seat at 5 and arms at 7 and resistance at 2 for 5 min   Side Step 10 ft x 2  Side Step with yellow band  10 ft x 4    Side Step with #3 AW 10 ft x 4    Left Side Lying Right Hip Abduction with #3 AW 1 x 10  -Pt reports increased discomfort in her right groin and right lateral hip   Left Side Lying Right Hip Abduction with #2.5 AW 1 x 10  -Pt reports improvement with  discomfort in right hip   Right Side Lying Left Hip Abduction with #2.5 AW 2 x 10   -Pt reports increased discomfort in groin musculature  Standing Hip Adductor Stretch 4 x 60 sec    Matrix Hip Flexion #40 2 x 10    -min VC to decrease speed so it is difficult to hear clank of plates   Standing Marches on RLE with BUE support #2.5 AW 1 x 10  Standing Marches on RLE with BUE support #5 AW 1 x 10      PATIENT EDUCATION:  Education details:  Form and technique for correct performance of exercise and explanation about potential causes for underlying deficits  Person educated: Patient Education method: Programmer, multimedia, Demonstration, Verbal cues, and Handouts Education comprehension: verbalized understanding, returned demonstration, and verbal cues required  HOME EXERCISE PROGRAM: Access Code: [email or web address] URL: [email or web address] Date: 01/13/2024 Prepared by: Toribio Servant  Exercises - Seated 3 Way Exercise Ball Roll Out Stretch  - 1 x daily - 7 x weekly - 3 sets - 10 reps - Supine Lower Trunk Rotation  - 1 x daily - 7 x weekly - 2 sets - 10 reps - 3 sec  hold - Cat Cow  - 1 x daily - 7 x weekly - 2 sets - 10 reps - 5-10 sec  hold - Child's Pose Stretch  - 1 x daily - 7 x weekly - 3 reps - 30-60 sec hold - Standing Forward-Bent Horizontal Abduction With Dumbbells  - 3-4 x weekly - 3 sets - 10 reps - Shoulder Bent over Y's   - 3-4 x weekly - 3 sets - 10 reps - Standing Hip Extension  with ankle weights    - 3-4 x weekly - 3 sets - 10 reps - Forearm to Full Planks  - 3-4 x weekly - 5 reps - 10 sec  hold - Sidelying Hip Abduction  - 3-4 x weekly - 3 sets - 10 reps - Standing March with Counter Support  - 3-4 x weekly - 3 sets - 10 reps  ASSESSMENT:  CLINICAL IMPRESSION: Pt continues to show improvement in LE strength and pain tolerance with ability to perform progressed hip strengthening exercises without pain and discomfort. She demonstrates increased right sided weakness compared to left side especially with hip flexion. She will continue to benefit from skilled PT to address these aforementioned deficits to continue to stand and bend to perform home care and baking tasks without being limited by pain.   OBJECTIVE IMPAIRMENTS: decreased activity tolerance, decreased strength, and pain.   ACTIVITY LIMITATIONS: bending, toileting, and hygiene/grooming  PARTICIPATION LIMITATIONS: meal prep, cleaning, and  occupation  PERSONAL FACTORS: Age, Fitness, Time since onset of injury/illness/exacerbation, and 1 comorbidity: HLD are also affecting patient's functional outcome.   REHAB POTENTIAL: Good  CLINICAL DECISION MAKING: Stable/uncomplicated  EVALUATION COMPLEXITY: Low   GOALS: Goals reviewed with patient? No  SHORT TERM GOALS: Target date: 12/17/2023  Patient will demonstrate undestanding of home exercise plan by performing exercises correctly with evidence of good carry over with min to no verbal or tactile cues .   Baseline: NT 12/10/23: Performing independently   Goal status: ACHIEVED    2.  Patient will demonstrate understanding of correct ergonomic work place setup to avoid straining mid and low back by demonstrating this setup for physical therapist with little to no cueing. Baseline: NT 12/17/23: Pt's activities require some rounding of shoulders, but further analysis needs to be completed Goal status: ACHIEVED  LONG TERM GOALS: Target date: 02/25/2024  Patient will demonstrate an improvement of >=3 pts on the patient specific functional scale (PSFS) as evidence of an in improvement in function that is not due to chance.  Baseline: 7 for baseline with washing dishes,  9 sitting for a long period of time Goal status: ONGOING  2.  Patient will improve hip strength >=1/3 grade MMT (ie 4- to 4) and abdominal strength by >=1 grade for improved spinal stability and low back function. Baseline: Hip Ext R/L 4-/4- , Hip Flex R/L 4/4,  Goal status: ONGOING    3.  Patient will improve shoulder and periscapular strength by >=1/3 grade MMT (Iie 4- to 4) for improved thoracic stability and function to return to maintaining standing forward trunk flexion positions without pain and discomfort to perform home care and baking tasks without being limited by pain and discomfort.   Baseline: Lower Trap R/L 4/4-, Mid Trap R/L 4/4, Shoulder Extension R/L 4/4   Goal status: ONGOING      PLAN:  PT FREQUENCY: 1-2x/week  PT DURATION: 12 weeks  PLANNED INTERVENTIONS: 97164- PT Re-evaluation, 97750- Physical Performance Testing, 97110-Therapeutic exercises, 97530- Therapeutic activity, 97112- Neuromuscular re-education, 97535- Self Care, [id number]- Manual therapy, [id number]- Gait training, [phone]- Canalith repositioning, [id number]- Aquatic Therapy, [phone]- Electrical stimulation (unattended), [phone]- Electrical stimulation (manual), [id number]- Traction (mechanical), 20560 (1-2 muscles), 20561 (3+ muscles)- Dry Needling, Patient/Family education, Balance training, Stair training, Taping, Joint mobilization, Joint manipulation, Spinal manipulation, Spinal mobilization, Vestibular training, DME instructions, Cryotherapy, and Moist heat.  PLAN FOR NEXT SESSION: Reassess goals. Determine what is still weak and strengthen it.     Toribio Servant PT, DPT  [facility] [address], [place], [address] Phone: [phone]   Fax:  [phone]

## 2024-01-15 ENCOUNTER — Encounter: Admitting: Physical Therapy

## 2024-01-15 ENCOUNTER — Ambulatory Visit
Admission: RE | Admit: 2024-01-15 | Discharge: 2024-01-15 | Disposition: A | Discharge status: Home / Self Care | Source: Ambulatory Visit | Attending: Primary Care | Admitting: Primary Care

## 2024-01-15 DIAGNOSIS — E041 Nontoxic single thyroid nodule: Secondary | ICD-10-CM | POA: Insufficient documentation

## 2024-01-17 ENCOUNTER — Ambulatory Visit: Payer: Self-pay | Admitting: Primary Care

## 2024-01-17 DIAGNOSIS — E041 Nontoxic single thyroid nodule: Secondary | ICD-10-CM

## 2024-01-21 ENCOUNTER — Ambulatory Visit: Admitting: Physical Therapy

## 2024-01-21 DIAGNOSIS — M25511 Pain in right shoulder: Secondary | ICD-10-CM | POA: Diagnosis not present

## 2024-01-21 DIAGNOSIS — M549 Dorsalgia, unspecified: Secondary | ICD-10-CM | POA: Diagnosis not present

## 2024-01-21 DIAGNOSIS — M25512 Pain in left shoulder: Secondary | ICD-10-CM | POA: Diagnosis not present

## 2024-01-21 DIAGNOSIS — M5459 Other low back pain: Secondary | ICD-10-CM | POA: Diagnosis not present

## 2024-01-21 NOTE — Therapy (Signed)
 OUTPATIENT PHYSICAL THERAPY THORACOLUMBAR TREATMENT   Patient Name: Kelly James MRN: [id number] DOB:September 27, 1955, 68 y.o., female Today's Date: 01/21/2024  END OF SESSION:  PT End of Session - 01/21/24 1346     Visit Number 8    Number of Visits 24    Date for PT Re-Evaluation 02/25/24    Authorization Type [employer] 2025    Authorization - Visit Number 7    Authorization - Number of Visits 24    Progress Note Due on Visit 10    PT Start Time 1345    PT Stop Time 1430    PT Time Calculation (min) 45 min    Activity Tolerance Patient tolerated treatment well    Behavior During Therapy [facility] for tasks assessed/performed             Past Medical History:  Diagnosis Date   Atypical mole 09/10/2019   L ant thigh near the knee    Migraines    UTI (urinary tract infection)    Past Surgical History:  Procedure Laterality Date   TUBAL LIGATION  1984   Patient Active Problem List   Diagnosis Date Noted   Localized skin mass, lump, or swelling 01/01/2024   Chronic midline thoracic back pain 11/28/2023   Fainting spell 01/24/2023   Foot mass, left 11/21/2022   Urinary frequency 06/20/2022   Urinary incontinence 06/20/2022   Acute shoulder pain 11/03/2021   Palpitations 08/16/2021   Other fatigue 07/05/2021   Rash and nonspecific skin eruption 05/02/2021   Elevated blood pressure reading in office without diagnosis of hypertension 05/02/2021   Frequent headaches 10/21/2020   Joint stiffness 03/03/2020   Genital herpes 03/03/2020   Preventative health care 07/17/2019   Hyperlipidemia 07/17/2019   GERD (gastroesophageal reflux disease) 07/17/2019   Vertigo 08/27/2018   Knee pain 05/03/2017    PCP: Comer Gaskins NP  REFERRING PROVIDER: Comer Gaskins NP   REFERRING DIAG: [phone] (ICD-10-CM) - Chronic midline thoracic back pain  Rationale for Evaluation and Treatment: Rehabilitation  THERAPY DIAG:  Other low back pain  Mid back pain  ONSET DATE:  Started 1992    SUBJECTIVE:                                                                                                                                                                                           SUBJECTIVE STATEMENT: Pt reports that she did have an incidence of low back pain when standing up and it lasts for three to four days. It has since gone away.    PERTINENT HISTORY:  Pt reports that she started to experiencing pain when gardening,  but it is not until recently that started to notice it more. 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 She has increased mid and low back pain when bending forward for long periods of time. She does not experience increased pain in the morning, but just when she is performing baking related tasks and sporadically when raising up from bending forward.   PAIN:  Are you having pain? No  PRECAUTIONS: None  RED FLAGS: None   WEIGHT BEARING RESTRICTIONS: No  FALLS:  Has patient fallen in last 6 months? No  LIVING ENVIRONMENT: Lives with: lives with their family Lives in: House/apartment Stairs: Yes: Internal: 13 steps; on right going up and External: 6 steps; on right going up Has following equipment at home: Single point cane  OCCUPATION: Retired  PLOF: Independent  PATIENT GOALS: Pt wants to experience less pain when working and know how to treat this condition.    OBJECTIVE:  Note: Objective measures were completed at Evaluation unless otherwise noted.  VITALS: BP 136/77 HR 77   DIAGNOSTIC FINDINGS:  NEXT MD VISIT: CLINICAL DATA:  chronic thoracic back pain   EXAM: THORACIC SPINE - 3 VIEWS   COMPARISON:  None Available.   FINDINGS: Eleven rib-bearing thoracic type vertebral bodies. Otherwise, normal alignment with expected thoracic kyphosis. The cervicothoracic junction alignment is normal. Vertebral body heights are well maintained without acute fracture. Mild intervertebral disc  height loss involving a couple of midthoracic vertebral bodies with fairly diffuse osteophyte formation throughout the thoracic spine. The soft tissues are otherwise unremarkable.   IMPRESSION: 1. No acute fracture or malalignment of the thoracic spine. 2. Mild degenerative disc disease within the midthoracic spine.     Electronically Signed   By: Rogelia Myers M.D.   On: 11/28/2023 10:48  PATIENT SURVEYS:   Patient Specific Functional Scale (PSFS):    COGNITION: Overall cognitive status: Within functional limits for tasks assessed     SENSATION: WFL  MUSCLE LENGTH: Hamstrings: Right 90 deg; Left 90 deg Thomas test: 0 degrees bilaterally  Ely's Test: Negative bilaterally   POSTURE: rounded shoulders  PALPATION: No area TTP, increased tension in thoracic paraspinals   LUMBAR ROM:   AROM eval  Flexion 100%  Extension 100%  Right lateral flexion 100%  Left lateral flexion 100%  Right rotation 100%  Left rotation 100%   (Blank rows = not tested)    LOWER EXTREMITY MMT:    MMT Right eval Left eval Right  01/21/24 Left  01/21/24  Hip flexion 4 4 4+ 4+  Hip extension 4- 4- 4 4  Hip abduction 4 4    Hip adduction 4 4    Hip internal rotation 4 4    Hip external rotation 4 4    Knee flexion 4 4    Knee extension 4 4    Ankle dorsiflexion 4 4    Ankle plantarflexion      Ankle inversion      Ankle eversion       (Blank rows = not tested)  PERISCAPULAR STRENGTH                                 Cervical ROM: No radicular symptoms with any cervical movements  Cervical                                AROM                                            Flex    45           45                                           Ext      45           45                       Lat Side Bend R/L 45/45        45/40                       Rotation       R/L     60/60       60/60      MMT Right Eval (12/17/23)  Left Eval (12/17/23) Right  01/21/24   Left  01/21/24  Shoulder flexion      Shoulder extension 4 4 4 4   Shoulder abduction      Shoulder adduction      Shoulder internal rotation      Shoulder external rotation      Rhomboids        Mid Trap  4 4 4 4   Lower Trap   4 4- 4 4  Elbow flexion      Elbow extension      Wrist flexion      Wrist extension      Wrist ulnar deviation      Wrist radial deviation      Wrist pronation      Wrist supination      Combined ER       Combined IR                                   (Blank rows = not tested)     LUMBAR SPECIAL TESTS:  Straight leg raise test: Negative, FABER test: Negative, Thomas test: Negative, and FADIR Negative   FUNCTIONAL TESTS:  Squats: NT, Single Leg Stance Test: NT    GAIT: No gait analysis performed    TREATMENT DATE:   01/21/24: THEREX  Nu-Step with seat at 5 and arms at 7 and resistance at 2 for 5 min   Periscapular MMT (See above)  Hip MMT (See Above)   Patient Specific Functional Scale -Washing Dishes 9   -Siting for a long period of time  9    Average: 9  Upper Trap Stretch 2 x 60 sec   SCM Stretch  2 x 60 sec    Physioball Shoulder T's 2 x 10  Physioball Shoulder Y's 2 x 10   Lumbar Flexion AROM Ball Rolls Center Flexion 1 x 10  -Pt reports increased pain in her right triceps     PATIENT EDUCATION:  Education details: Form and technique for correct performance  of exercise and explanation about potential causes for underlying deficits  Person educated: Patient Education method: Explanation, Demonstration, Verbal cues, and Handouts Education comprehension: verbalized understanding, returned demonstration, and verbal cues required  HOME EXERCISE PROGRAM: Access Code: [email or web address] URL: [email or web address] Date: 01/21/2024 Prepared by: Toribio Servant  Exercises - Seated 3 Way Exercise Ball Roll Out Stretch  - 1 x daily - 7 x weekly - 3 sets - 10 reps - Seated Cervical Sidebending  Stretch  - 1 x daily - 7 x weekly - 3 reps - 30-60 sec  hold - Sternocleidomastoid Stretch  - 1 x daily - 7 x weekly - 3 reps - 30-60 sec  hold - Supine Lower Trunk Rotation  - 1 x daily - 7 x weekly - 2 sets - 10 reps - 3 sec  hold - Cat Cow  - 1 x daily - 7 x weekly - 2 sets - 10 reps - 5-10 sec  hold - Child's Pose Stretch  - 1 x daily - 7 x weekly - 3 reps - 30-60 sec hold - Prone Shoulder Horizontal Abduction on Swiss Ball  - 3-4 x weekly - 3 sets - 10 reps - Prone Lower Trapezius Strengthening on Swiss Ball  - 3-4 x weekly - 3 sets - 10 reps - Sidelying Hip Abduction  - 3-4 x weekly - 3 sets - 10 reps - Forearm to Full Planks  - 3-4 x weekly - 5 reps - 10 sec  hold - Standing March with Counter Support  - 3-4 x weekly - 3 sets - 10 reps - Standing Hip Extension  with ankle weights    - 1 x daily - 3-4 x weekly - 3 sets - 10 reps   ASSESSMENT:  CLINICAL IMPRESSION: Pt progressing towards goals with improvement in hip and periscapular strength and perception of function. She does continue to experience low back pain but this is intermittent and it does not limit her activities of daily living or physical activity. She is nearing the end of her plan of care with only one remaining goal. She will continue to benefit from skilled PT to address these aforementioned deficits to continue to stand and bend to perform home care and baking tasks without being limited by pain.   OBJECTIVE IMPAIRMENTS: decreased activity tolerance, decreased strength, and pain.   ACTIVITY LIMITATIONS: bending, toileting, and hygiene/grooming  PARTICIPATION LIMITATIONS: meal prep, cleaning, and occupation  PERSONAL FACTORS: Age, Fitness, Time since onset of injury/illness/exacerbation, and 1 comorbidity: HLD are also affecting patient's functional outcome.   REHAB POTENTIAL: Good  CLINICAL DECISION MAKING: Stable/uncomplicated  EVALUATION COMPLEXITY: Low   GOALS: Goals reviewed with patient? No  SHORT  TERM GOALS: Target date: 12/17/2023  Patient will demonstrate undestanding of home exercise plan by performing exercises correctly with evidence of good carry over with min to no verbal or tactile cues .   Baseline: NT 12/10/23: Performing independently   Goal status: ACHIEVED    2.  Patient will demonstrate understanding of correct ergonomic work place setup to avoid straining mid and low back by demonstrating this setup for physical therapist with little to no cueing. Baseline: NT 12/17/23: Pt's activities require some rounding of shoulders, but further analysis needs to be completed Goal status: ACHIEVED      LONG TERM GOALS: Target date: 02/25/2024  Patient will demonstrate an improvement of >=3 pts on the patient specific functional scale (PSFS) as evidence of an in improvement in function that is not due to  chance.  Baseline: 7 for baseline with washing dishes,  9 sitting for a long period of time Total 16 pts 01/21/24: 9+9= 18 pts total   Goal status: ACHIEVED  2.  Patient will improve hip strength >=1/3 grade MMT (ie 4- to 4) and abdominal strength by >=1 grade for improved spinal stability and low back function. Baseline: Hip Ext R/L 4-/4- , Hip Flex R/L 4/4 01/21/24: Hip Flex 4+/4+  Goal status: ACHIEVED      3.  Patient will improve shoulder and periscapular strength by >=1/3 grade MMT (Iie 4- to 4) for improved thoracic stability and function to return to maintaining standing forward trunk flexion positions without pain and discomfort to perform home care and baking tasks without being limited by pain and discomfort.   Baseline: Lower Trap R/L 4/4-, Mid Trap R/L 4/4, Shoulder Extension R/L 4/4  01/21/24: Lower Trap R/L 4/4, Mid Trap R/L 4/4, Shoulder Ext R/L 4/4   Goal status: PARTIALLY MET        PLAN:  PT FREQUENCY: 1-2x/week  PT DURATION: 12 weeks  PLANNED INTERVENTIONS: 97164- PT Re-evaluation, 97750- Physical Performance Testing, 97110-Therapeutic exercises, 97530-  Therapeutic activity, 97112- Neuromuscular re-education, 97535- Self Care, [id number]- Manual therapy, [id number]- Gait training, [phone]- Canalith repositioning, [id number]- Aquatic Therapy, [id number]- Electrical stimulation (unattended), [phone]- Electrical stimulation (manual), [id number]- Traction (mechanical), 20560 (1-2 muscles), 20561 (3+ muscles)- Dry Needling, Patient/Family education, Balance training, Stair training, Taping, Joint mobilization, Joint manipulation, Spinal manipulation, Spinal mobilization, Vestibular training, DME instructions, Cryotherapy, and Moist heat.  PLAN FOR NEXT SESSION: OMEGA Face Pulls and Lat Pull Downs. Wall Slides. Bird dogs in plank?    Toribio Servant PT, DPT  [facility] [address] [place], [address] Phone: [phone]   Fax:  [phone]

## 2024-01-23 ENCOUNTER — Encounter: Payer: Self-pay | Admitting: *Deleted

## 2024-01-23 ENCOUNTER — Encounter: Admitting: Physical Therapy

## 2024-01-27 ENCOUNTER — Ambulatory Visit: Admitting: Physical Therapy

## 2024-01-27 DIAGNOSIS — M5459 Other low back pain: Secondary | ICD-10-CM | POA: Diagnosis not present

## 2024-01-27 DIAGNOSIS — M25512 Pain in left shoulder: Secondary | ICD-10-CM | POA: Diagnosis not present

## 2024-01-27 DIAGNOSIS — M549 Dorsalgia, unspecified: Secondary | ICD-10-CM | POA: Diagnosis not present

## 2024-01-27 DIAGNOSIS — M25511 Pain in right shoulder: Secondary | ICD-10-CM | POA: Diagnosis not present

## 2024-01-27 NOTE — Therapy (Signed)
 OUTPATIENT PHYSICAL THERAPY THORACOLUMBAR TREATMENT   Patient Name: Kelly James MRN: [id number] DOB:1955-09-06, 68 y.o., female Today's Date: 01/27/2024  END OF SESSION:  PT End of Session - 01/27/24 1438     Visit Number 9    Number of Visits 24    Date for PT Re-Evaluation 02/25/24    Authorization Type [employer] 2025    Authorization - Visit Number 9    Authorization - Number of Visits 24    Progress Note Due on Visit 10    PT Start Time 1430    PT Stop Time 1515    PT Time Calculation (min) 45 min    Activity Tolerance Patient tolerated treatment well    Behavior During Therapy [facility] for tasks assessed/performed              Past Medical History:  Diagnosis Date   Atypical mole 09/10/2019   L ant thigh near the knee    Migraines    UTI (urinary tract infection)    Past Surgical History:  Procedure Laterality Date   TUBAL LIGATION  1984   Patient Active Problem List   Diagnosis Date Noted   Localized skin mass, lump, or swelling 01/01/2024   Chronic midline thoracic back pain 11/28/2023   Fainting spell 01/24/2023   Foot mass, left 11/21/2022   Urinary frequency 06/20/2022   Urinary incontinence 06/20/2022   Acute shoulder pain 11/03/2021   Palpitations 08/16/2021   Other fatigue 07/05/2021   Rash and nonspecific skin eruption 05/02/2021   Elevated blood pressure reading in office without diagnosis of hypertension 05/02/2021   Frequent headaches 10/21/2020   Joint stiffness 03/03/2020   Genital herpes 03/03/2020   Preventative health care 07/17/2019   Hyperlipidemia 07/17/2019   GERD (gastroesophageal reflux disease) 07/17/2019   Vertigo 08/27/2018   Knee pain 05/03/2017    PCP: Comer Gaskins NP  REFERRING PROVIDER: Comer Gaskins NP   REFERRING DIAG: [phone] (ICD-10-CM) - Chronic midline thoracic back pain  Rationale for Evaluation and Treatment: Rehabilitation  THERAPY DIAG:  Other low back pain  Mid back pain  ONSET DATE:  Started 1992    SUBJECTIVE:                                                                                                                                                                                           SUBJECTIVE STATEMENT: Pt states that pain continues to remain low and that she continues to do exercises without much difficulty.    PERTINENT HISTORY:  Pt reports that she started to experiencing pain when gardening, but it is not until recently that started to  notice it more. 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 She has increased mid and low back pain when bending forward for long periods of time. She does not experience increased pain in the morning, but just when she is performing baking related tasks and sporadically when raising up from bending forward.   PAIN:  Are you having pain? No  PRECAUTIONS: None  RED FLAGS: None   WEIGHT BEARING RESTRICTIONS: No  FALLS:  Has patient fallen in last 6 months? No  LIVING ENVIRONMENT: Lives with: lives with their family Lives in: House/apartment Stairs: Yes: Internal: 13 steps; on right going up and External: 6 steps; on right going up Has following equipment at home: Single point cane  OCCUPATION: Retired  PLOF: Independent  PATIENT GOALS: Pt wants to experience less pain when working and know how to treat this condition.    OBJECTIVE:  Note: Objective measures were completed at Evaluation unless otherwise noted.  VITALS: BP 136/77 HR 77   DIAGNOSTIC FINDINGS:  NEXT MD VISIT: CLINICAL DATA:  chronic thoracic back pain   EXAM: THORACIC SPINE - 3 VIEWS   COMPARISON:  None Available.   FINDINGS: Eleven rib-bearing thoracic type vertebral bodies. Otherwise, normal alignment with expected thoracic kyphosis. The cervicothoracic junction alignment is normal. Vertebral body heights are well maintained without acute fracture. Mild intervertebral disc height loss involving a couple of  midthoracic vertebral bodies with fairly diffuse osteophyte formation throughout the thoracic spine. The soft tissues are otherwise unremarkable.   IMPRESSION: 1. No acute fracture or malalignment of the thoracic spine. 2. Mild degenerative disc disease within the midthoracic spine.     Electronically Signed   By: Rogelia Myers M.D.   On: 11/28/2023 10:48  PATIENT SURVEYS:   Patient Specific Functional Scale (PSFS):    COGNITION: Overall cognitive status: Within functional limits for tasks assessed     SENSATION: WFL  MUSCLE LENGTH: Hamstrings: Right 90 deg; Left 90 deg Thomas test: 0 degrees bilaterally  Ely's Test: Negative bilaterally   POSTURE: rounded shoulders  PALPATION: No area TTP, increased tension in thoracic paraspinals   LUMBAR ROM:   AROM eval  Flexion 100%  Extension 100%  Right lateral flexion 100%  Left lateral flexion 100%  Right rotation 100%  Left rotation 100%   (Blank rows = not tested)    LOWER EXTREMITY MMT:    MMT Right eval Left eval Right  01/21/24 Left  01/21/24  Hip flexion 4 4 4+ 4+  Hip extension 4- 4- 4 4  Hip abduction 4 4    Hip adduction 4 4    Hip internal rotation 4 4    Hip external rotation 4 4    Knee flexion 4 4    Knee extension 4 4    Ankle dorsiflexion 4 4    Ankle plantarflexion      Ankle inversion      Ankle eversion       (Blank rows = not tested)  PERISCAPULAR STRENGTH                                 Cervical ROM: No radicular symptoms with any cervical movements  Cervical                                AROM                                            Flex    45           45                                           Ext      45           45                       Lat Side Bend R/L 45/45        45/40                       Rotation       R/L     60/60       60/60      MMT Right Eval (12/17/23) Left Eval (12/17/23) Right   01/21/24   Left  01/21/24  Shoulder flexion      Shoulder extension 4 4 4 4   Shoulder abduction      Shoulder adduction      Shoulder internal rotation      Shoulder external rotation      Rhomboids        Mid Trap  4 4 4 4   Lower Trap   4 4- 4 4  Elbow flexion      Elbow extension      Wrist flexion      Wrist extension      Wrist ulnar deviation      Wrist radial deviation      Wrist pronation      Wrist supination      Combined ER       Combined IR                                   (Blank rows = not tested)     LUMBAR SPECIAL TESTS:  Straight leg raise test: Negative, FABER test: Negative, Thomas test: Negative, and FADIR Negative   FUNCTIONAL TESTS:  Squats: NT, Single Leg Stance Test: NT    GAIT: No gait analysis performed    TREATMENT DATE:   01/27/24: THEREX  Nu-Step with seat at 5 and arms at 7 and resistance at 2 for 5 min   Lat Pull Down with blue band  1 x 10   -min VC to decreased eccentric portion of exercise  Lat Pull Down with marches with blue band for resistance 3 x 10   OMEGA Face Pulls #5 lbs 1 x 10  Face Pulls with red band 2 x 10   Superman with arm arcs for posterior chain strengthening 1 x 10      PATIENT EDUCATION:  Education details: Form and technique for correct performance of exercise and explanation about potential causes for underlying deficits  Person educated: Patient Education method: Explanation, Demonstration, Verbal cues, and  Handouts Education comprehension: verbalized understanding, returned demonstration, and verbal cues required  HOME EXERCISE PROGRAM: Access Code: [email or web address] URL: [email or web address] Date: 01/21/2024 Prepared by: Toribio Servant  Exercises - Seated 3 Way Exercise Ball Roll Out Stretch  - 1 x daily - 7 x weekly - 3 sets - 10 reps - Seated Cervical Sidebending Stretch  - 1 x daily - 7 x weekly - 3 reps - 30-60 sec  hold - Sternocleidomastoid Stretch  - 1 x daily - 7 x weekly - 3 reps -  30-60 sec  hold - Supine Lower Trunk Rotation  - 1 x daily - 7 x weekly - 2 sets - 10 reps - 3 sec  hold - Cat Cow  - 1 x daily - 7 x weekly - 2 sets - 10 reps - 5-10 sec  hold - Child's Pose Stretch  - 1 x daily - 7 x weekly - 3 reps - 30-60 sec hold - Prone Shoulder Horizontal Abduction on Swiss Ball  - 3-4 x weekly - 3 sets - 10 reps - Prone Lower Trapezius Strengthening on Swiss Ball  - 3-4 x weekly - 3 sets - 10 reps - Sidelying Hip Abduction  - 3-4 x weekly - 3 sets - 10 reps - Forearm to Full Planks  - 3-4 x weekly - 5 reps - 10 sec  hold - Standing March with Counter Support  - 3-4 x weekly - 3 sets - 10 reps - Standing Hip Extension  with ankle weights    - 1 x daily - 3-4 x weekly - 3 sets - 10 reps   ASSESSMENT:  CLINICAL IMPRESSION: Pt continues to progress towards goals with ongoing improvement in periscapular and hip strength with ability to perform progressed exercises without compensation and pain. She has continued to have little to no pain response towards exercises and daily activities an she is nearing the end of her plan of care. Despite her progress, pt does not show as much of an improvement in her perception of her ability to complete functional tasks, but this is likely due to the fact that her score was higher at baseline.  She will continue to benefit from skilled PT to address these aforementioned deficits to continue to stand and bend to perform home care and baking tasks without being limited by pain.    OBJECTIVE IMPAIRMENTS: decreased activity tolerance, decreased strength, and pain.   ACTIVITY LIMITATIONS: bending, toileting, and hygiene/grooming  PARTICIPATION LIMITATIONS: meal prep, cleaning, and occupation  PERSONAL FACTORS: Age, Fitness, Time since onset of injury/illness/exacerbation, and 1 comorbidity: HLD are also affecting patient's functional outcome.   REHAB POTENTIAL: Good  CLINICAL DECISION MAKING: Stable/uncomplicated  EVALUATION COMPLEXITY:  Low   GOALS: Goals reviewed with patient? No  SHORT TERM GOALS: Target date: 12/17/2023  Patient will demonstrate undestanding of home exercise plan by performing exercises correctly with evidence of good carry over with min to no verbal or tactile cues .   Baseline: NT 12/10/23: Performing independently   Goal status: ACHIEVED    2.  Patient will demonstrate understanding of correct ergonomic work place setup to avoid straining mid and low back by demonstrating this setup for physical therapist with little to no cueing. Baseline: NT 12/17/23: Pt's activities require some rounding of shoulders, but further analysis needs to be completed Goal status: ACHIEVED      LONG TERM GOALS: Target date: 02/25/2024  Patient will demonstrate an improvement of >=3 pts on the patient specific functional scale (PSFS)  as evidence of an in improvement in function that is not due to chance.  Baseline: 7 for baseline with washing dishes,  9 sitting for a long period of time Total 16 pts 01/21/24: 9+9= 18 pts total   Goal status: ACHIEVED  2.  Patient will improve hip strength >=1/3 grade MMT (ie 4- to 4) and abdominal strength by >=1 grade for improved spinal stability and low back function. Baseline: Hip Ext R/L 4-/4- , Hip Flex R/L 4/4 01/21/24: Hip Flex 4+/4+  Goal status: ACHIEVED      3.  Patient will improve shoulder and periscapular strength by >=1/3 grade MMT (Iie 4- to 4) for improved thoracic stability and function to return to maintaining standing forward trunk flexion positions without pain and discomfort to perform home care and baking tasks without being limited by pain and discomfort.   Baseline: Lower Trap R/L 4/4-, Mid Trap R/L 4/4, Shoulder Extension R/L 4/4  01/21/24: Lower Trap R/L 4/4, Mid Trap R/L 4/4, Shoulder Ext R/L 4/4   Goal status: PARTIALLY MET        PLAN:  PT FREQUENCY: 1-2x/week  PT DURATION: 12 weeks  PLANNED INTERVENTIONS: 97164- PT Re-evaluation, 97750- Physical  Performance Testing, 97110-Therapeutic exercises, 97530- Therapeutic activity, 97112- Neuromuscular re-education, 97535- Self Care, [id number]- Manual therapy, [id number]- Gait training, [phone]- Canalith repositioning, [id number]- Aquatic Therapy, [id number]- Electrical stimulation (unattended), [phone]- Electrical stimulation (manual), [id number]- Traction (mechanical), 20560 (1-2 muscles), 20561 (3+ muscles)- Dry Needling, Patient/Family education, Balance training, Stair training, Taping, Joint mobilization, Joint manipulation, Spinal manipulation, Spinal mobilization, Vestibular training, DME instructions, Cryotherapy, and Moist heat.  PLAN FOR NEXT SESSION: Progress note- and discharge if feeling good.  Finalize home exercise plan including combined hip and periscapular strengthening exercises.      Toribio Servant PT, DPT  [facility] [address] [place], [address] Phone: [phone]   Fax:  [phone]

## 2024-01-29 NOTE — Therapy (Signed)
 OUTPATIENT PHYSICAL THERAPY THORACOLUMBAR TREATMENT   Patient Name: Abbie Berling MRN: [id number] DOB:11-26-1955, 68 y.o., female Today's Date: 01/29/2024  END OF SESSION:        Past Medical History:  Diagnosis Date   Atypical mole 09/10/2019   L ant thigh near the knee    Migraines    UTI (urinary tract infection)    Past Surgical History:  Procedure Laterality Date   TUBAL LIGATION  1984   Patient Active Problem List   Diagnosis Date Noted   Localized skin mass, lump, or swelling 01/01/2024   Chronic midline thoracic back pain 11/28/2023   Fainting spell 01/24/2023   Foot mass, left 11/21/2022   Urinary frequency 06/20/2022   Urinary incontinence 06/20/2022   Acute shoulder pain 11/03/2021   Palpitations 08/16/2021   Other fatigue 07/05/2021   Rash and nonspecific skin eruption 05/02/2021   Elevated blood pressure reading in office without diagnosis of hypertension 05/02/2021   Frequent headaches 10/21/2020   Joint stiffness 03/03/2020   Genital herpes 03/03/2020   Preventative health care 07/17/2019   Hyperlipidemia 07/17/2019   GERD (gastroesophageal reflux disease) 07/17/2019   Vertigo 08/27/2018   Knee pain 05/03/2017    PCP: Comer Gaskins NP  REFERRING PROVIDER: Comer Gaskins NP   REFERRING DIAG: [id number] (ICD-10-CM) - Chronic midline thoracic back pain  Rationale for Evaluation and Treatment: Rehabilitation  THERAPY DIAG:  Other low back pain  Mid back pain  Acute pain of left shoulder  Acute pain of right shoulder  ONSET DATE: Started 1992    SUBJECTIVE:                                                                                                                                                                                           SUBJECTIVE STATEMENT: ***  Pt states that pain continues to remain low and that she continues to do exercises without much difficulty.    PERTINENT HISTORY:  Pt reports that she started to  experiencing pain when gardening, but it is not until recently that started to notice it more. 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 She has increased mid and low back pain when bending forward for long periods of time. She does not experience increased pain in the morning, but just when she is performing baking related tasks and sporadically when raising up from bending forward.   PAIN:  Are you having pain? No  PRECAUTIONS: None  RED FLAGS: None   WEIGHT BEARING RESTRICTIONS: No  FALLS:  Has patient fallen in last 6 months? No  LIVING ENVIRONMENT: Lives with: lives with their family  Lives in: House/apartment Stairs: Yes: Internal: 13 steps; on right going up and External: 6 steps; on right going up Has following equipment at home: Single point cane  OCCUPATION: Retired  PLOF: Independent  PATIENT GOALS: Pt wants to experience less pain when working and know how to treat this condition.    OBJECTIVE:  Note: Objective measures were completed at Evaluation unless otherwise noted.  VITALS: BP 136/77 HR 77   DIAGNOSTIC FINDINGS:  NEXT MD VISIT: CLINICAL DATA:  chronic thoracic back pain   EXAM: THORACIC SPINE - 3 VIEWS   COMPARISON:  None Available.   FINDINGS: Eleven rib-bearing thoracic type vertebral bodies. Otherwise, normal alignment with expected thoracic kyphosis. The cervicothoracic junction alignment is normal. Vertebral body heights are well maintained without acute fracture. Mild intervertebral disc height loss involving a couple of midthoracic vertebral bodies with fairly diffuse osteophyte formation throughout the thoracic spine. The soft tissues are otherwise unremarkable.   IMPRESSION: 1. No acute fracture or malalignment of the thoracic spine. 2. Mild degenerative disc disease within the midthoracic spine.     Electronically Signed   By: Rogelia Myers M.D.   On: 11/28/2023 10:48  PATIENT SURVEYS:   Patient  Specific Functional Scale (PSFS):    COGNITION: Overall cognitive status: Within functional limits for tasks assessed     SENSATION: WFL  MUSCLE LENGTH: Hamstrings: Right 90 deg; Left 90 deg Thomas test: 0 degrees bilaterally  Ely's Test: Negative bilaterally   POSTURE: rounded shoulders  PALPATION: No area TTP, increased tension in thoracic paraspinals   LUMBAR ROM:   AROM eval  Flexion 100%  Extension 100%  Right lateral flexion 100%  Left lateral flexion 100%  Right rotation 100%  Left rotation 100%   (Blank rows = not tested)    LOWER EXTREMITY MMT:    MMT Right eval Left eval Right  01/21/24 Left  01/21/24  Hip flexion 4 4 4+ 4+  Hip extension 4- 4- 4 4  Hip abduction 4 4    Hip adduction 4 4    Hip internal rotation 4 4    Hip external rotation 4 4    Knee flexion 4 4    Knee extension 4 4    Ankle dorsiflexion 4 4    Ankle plantarflexion      Ankle inversion      Ankle eversion       (Blank rows = not tested)  PERISCAPULAR STRENGTH                                 Cervical ROM: No radicular symptoms with any cervical movements                                                                 Cervical                                AROM  Flex    45           45                                           Ext      45           45                       Lat Side Bend R/L 45/45        45/40                       Rotation       R/L     60/60       60/60      MMT Right Eval (12/17/23) Left Eval (12/17/23) Right  01/21/24   Left  01/21/24  Shoulder flexion      Shoulder extension 4 4 4 4   Shoulder abduction      Shoulder adduction      Shoulder internal rotation      Shoulder external rotation      Rhomboids        Mid Trap  4 4 4 4   Lower Trap   4 4- 4 4  Elbow flexion      Elbow extension      Wrist flexion      Wrist extension      Wrist ulnar deviation      Wrist radial deviation      Wrist  pronation      Wrist supination      Combined ER       Combined IR                                   (Blank rows = not tested)     LUMBAR SPECIAL TESTS:  Straight leg raise test: Negative, FABER test: Negative, Thomas test: Negative, and FADIR Negative   FUNCTIONAL TESTS:  Squats: NT, Single Leg Stance Test: NT    GAIT: No gait analysis performed    TREATMENT DATE: *** 01/29/24   D/c & progress note  01/27/24: THEREX  Nu-Step with seat at 5 and arms at 7 and resistance at 2 for 5 min   Lat Pull Down with blue band  1 x 10   -min VC to decreased eccentric portion of exercise  Lat Pull Down with marches with blue band for resistance 3 x 10   OMEGA Face Pulls #5 lbs 1 x 10  Face Pulls with red band 2 x 10   Superman with arm arcs for posterior chain strengthening 1 x 10      PATIENT EDUCATION:  Education details: Form and technique for correct performance of exercise and explanation about potential causes for underlying deficits  Person educated: Patient Education method: Programmer, multimedia, Demonstration, Verbal cues, and Handouts Education comprehension: verbalized understanding, returned demonstration, and verbal cues required  HOME EXERCISE PROGRAM: Access Code: [email or web address] URL: [email or web address] Date: 01/21/2024 Prepared by: Toribio Servant  Exercises - Seated 3 Way Exercise Ball Roll Out Stretch  - 1 x daily - 7 x weekly - 3 sets - 10 reps - Seated Cervical Sidebending Stretch  - 1 x daily - 7 x weekly - 3 reps -  30-60 sec  hold - Sternocleidomastoid Stretch  - 1 x daily - 7 x weekly - 3 reps - 30-60 sec  hold - Supine Lower Trunk Rotation  - 1 x daily - 7 x weekly - 2 sets - 10 reps - 3 sec  hold - Cat Cow  - 1 x daily - 7 x weekly - 2 sets - 10 reps - 5-10 sec  hold - Child's Pose Stretch  - 1 x daily - 7 x weekly - 3 reps - 30-60 sec hold - Prone Shoulder Horizontal Abduction on Swiss Ball  - 3-4 x weekly - 3 sets - 10 reps - Prone Lower Trapezius  Strengthening on Swiss Ball  - 3-4 x weekly - 3 sets - 10 reps - Sidelying Hip Abduction  - 3-4 x weekly - 3 sets - 10 reps - Forearm to Full Planks  - 3-4 x weekly - 5 reps - 10 sec  hold - Standing March with Counter Support  - 3-4 x weekly - 3 sets - 10 reps - Standing Hip Extension  with ankle weights    - 1 x daily - 3-4 x weekly - 3 sets - 10 reps   ASSESSMENT:  CLINICAL IMPRESSION: ***  Pt continues to progress towards goals with ongoing improvement in periscapular and hip strength with ability to perform progressed exercises without compensation and pain. She has continued to have little to no pain response towards exercises and daily activities an she is nearing the end of her plan of care. Despite her progress, pt does not show as much of an improvement in her perception of her ability to complete functional tasks, but this is likely due to the fact that her score was higher at baseline.  She will continue to benefit from skilled PT to address these aforementioned deficits to continue to stand and bend to perform home care and baking tasks without being limited by pain.    OBJECTIVE IMPAIRMENTS: decreased activity tolerance, decreased strength, and pain.   ACTIVITY LIMITATIONS: bending, toileting, and hygiene/grooming  PARTICIPATION LIMITATIONS: meal prep, cleaning, and occupation  PERSONAL FACTORS: Age, Fitness, Time since onset of injury/illness/exacerbation, and 1 comorbidity: HLD are also affecting patient's functional outcome.   REHAB POTENTIAL: Good  CLINICAL DECISION MAKING: Stable/uncomplicated  EVALUATION COMPLEXITY: Low   GOALS: Goals reviewed with patient? No  SHORT TERM GOALS: Target date: 12/17/2023  Patient will demonstrate undestanding of home exercise plan by performing exercises correctly with evidence of good carry over with min to no verbal or tactile cues .   Baseline: NT 12/10/23: Performing independently   Goal status: ACHIEVED    2.  Patient will  demonstrate understanding of correct ergonomic work place setup to avoid straining mid and low back by demonstrating this setup for physical therapist with little to no cueing. Baseline: NT 12/17/23: Pt's activities require some rounding of shoulders, but further analysis needs to be completed Goal status: ACHIEVED      LONG TERM GOALS: Target date: 02/25/2024  Patient will demonstrate an improvement of >=3 pts on the patient specific functional scale (PSFS) as evidence of an in improvement in function that is not due to chance.  Baseline: 7 for baseline with washing dishes,  9 sitting for a long period of time Total 16 pts 01/21/24: 9+9= 18 pts total   Goal status: ACHIEVED  2.  Patient will improve hip strength >=1/3 grade MMT (ie 4- to 4) and abdominal strength by >=1 grade for improved spinal  stability and low back function. Baseline: Hip Ext R/L 4-/4- , Hip Flex R/L 4/4 01/21/24: Hip Flex 4+/4+  Goal status: ACHIEVED      3.  Patient will improve shoulder and periscapular strength by >=1/3 grade MMT (Iie 4- to 4) for improved thoracic stability and function to return to maintaining standing forward trunk flexion positions without pain and discomfort to perform home care and baking tasks without being limited by pain and discomfort.   Baseline: Lower Trap R/L 4/4-, Mid Trap R/L 4/4, Shoulder Extension R/L 4/4  01/21/24: Lower Trap R/L 4/4, Mid Trap R/L 4/4, Shoulder Ext R/L 4/4   Goal status: PARTIALLY MET        PLAN:  PT FREQUENCY: 1-2x/week  PT DURATION: 12 weeks  PLANNED INTERVENTIONS: 97164- PT Re-evaluation, 97750- Physical Performance Testing, 97110-Therapeutic exercises, 97530- Therapeutic activity, 97112- Neuromuscular re-education, 97535- Self Care, [id number]- Manual therapy, [id number]- Gait training, [phone]- Canalith repositioning, [id number]- Aquatic Therapy, [id number]- Electrical stimulation (unattended), [id number]- Electrical stimulation (manual), [id number]- Traction (mechanical), 20560 (1-2 muscles),  20561 (3+ muscles)- Dry Needling, Patient/Family education, Balance training, Stair training, Taping, Joint mobilization, Joint manipulation, Spinal manipulation, Spinal mobilization, Vestibular training, DME instructions, Cryotherapy, and Moist heat.  PLAN FOR NEXT SESSION: Progress note and discharge if feeling good.  Finalize home exercise plan including combined hip and periscapular strengthening exercises.     Maryanne Finder, PT, DPT Physical Therapist - [place]  [facility]

## 2024-01-30 ENCOUNTER — Encounter: Payer: Self-pay | Admitting: Physical Therapy

## 2024-01-30 ENCOUNTER — Ambulatory Visit

## 2024-01-30 DIAGNOSIS — M25511 Pain in right shoulder: Secondary | ICD-10-CM | POA: Diagnosis not present

## 2024-01-30 DIAGNOSIS — M549 Dorsalgia, unspecified: Secondary | ICD-10-CM

## 2024-01-30 DIAGNOSIS — M25512 Pain in left shoulder: Secondary | ICD-10-CM | POA: Diagnosis not present

## 2024-01-30 DIAGNOSIS — M5459 Other low back pain: Secondary | ICD-10-CM

## 2024-01-30 NOTE — Progress Notes (Signed)
 Patient for US  guided FNA thyroid  nodule biopsy on Friday 01/31/24, I called and spoke with the patient on the phone and gave pre-procedure instructions. Pt was made aware to be here at 12:30p and check in at the [facility] registration desk. Pt stated understanding. Called 01/29/24

## 2024-01-31 ENCOUNTER — Ambulatory Visit
Admission: RE | Admit: 2024-01-31 | Discharge: 2024-01-31 | Disposition: A | Discharge status: Home / Self Care | Source: Ambulatory Visit | Attending: Primary Care | Admitting: Primary Care

## 2024-01-31 DIAGNOSIS — E041 Nontoxic single thyroid nodule: Secondary | ICD-10-CM | POA: Insufficient documentation

## 2024-01-31 MED ORDER — LIDOCAINE HCL (PF) 1 % IJ SOLN
10.0000 mL | Freq: Once | INTRAMUSCULAR | Status: DC
  Filled 2024-01-31: qty 10

## 2024-02-06 LAB — CYTOLOGY - NON PAP

## 2024-02-09 ENCOUNTER — Ambulatory Visit: Payer: Self-pay | Admitting: Primary Care

## 2024-02-10 ENCOUNTER — Ambulatory Visit: Admitting: Physical Therapy

## 2024-02-10 NOTE — Telephone Encounter (Signed)
 Kelly James, can we fax her thyroid  biopsy results to her GYN at [facility]?

## 2024-02-11 NOTE — Telephone Encounter (Signed)
 Results have been faxed to [facility] as requested.

## 2024-02-13 ENCOUNTER — Ambulatory Visit: Admitting: Physical Therapy

## 2024-02-18 ENCOUNTER — Encounter: Admitting: Physical Therapy

## 2024-02-18 DIAGNOSIS — H40013 Open angle with borderline findings, low risk, bilateral: Secondary | ICD-10-CM | POA: Diagnosis not present

## 2024-02-20 ENCOUNTER — Encounter: Admitting: Physical Therapy

## 2024-02-24 ENCOUNTER — Encounter: Admitting: Physical Therapy

## 2024-02-27 ENCOUNTER — Encounter: Admitting: Physical Therapy

## 2024-12-08 ENCOUNTER — Ambulatory Visit: Admitting: Dermatology

## 2024-12-30 ENCOUNTER — Ambulatory Visit
